# Patient Record
Sex: Male | Born: 1970
Health system: Southern US, Community
[De-identification: ages and names within clinical notes are randomized; demographics above are authoritative.]

## PROBLEM LIST (undated history)

## (undated) DIAGNOSIS — N390 Urinary tract infection, site not specified: Secondary | ICD-10-CM

## (undated) HISTORY — PX: EYE SURGERY: SHX253

## (undated) HISTORY — PX: LASIK: SHX215

---

## 1999-02-21 ENCOUNTER — Emergency Department (HOSPITAL_COMMUNITY): Admission: EM | Admit: 1999-02-21 | Discharge: 1999-02-21 | Payer: Self-pay | Admitting: Emergency Medicine

## 1999-02-28 ENCOUNTER — Emergency Department (HOSPITAL_COMMUNITY): Admission: EM | Admit: 1999-02-28 | Discharge: 1999-02-28 | Payer: Self-pay | Admitting: Emergency Medicine

## 2001-09-23 ENCOUNTER — Encounter: Payer: Self-pay | Admitting: *Deleted

## 2001-09-23 ENCOUNTER — Emergency Department (HOSPITAL_COMMUNITY): Admission: EM | Admit: 2001-09-23 | Discharge: 2001-09-23 | Payer: Self-pay

## 2003-12-03 ENCOUNTER — Encounter: Admission: RE | Admit: 2003-12-03 | Discharge: 2004-01-06 | Payer: Self-pay | Admitting: Family Medicine

## 2004-04-11 ENCOUNTER — Ambulatory Visit: Payer: Self-pay | Admitting: Family Medicine

## 2004-07-01 ENCOUNTER — Ambulatory Visit: Payer: Self-pay | Admitting: Family Medicine

## 2004-10-10 ENCOUNTER — Ambulatory Visit: Payer: Self-pay | Admitting: Family Medicine

## 2004-10-12 ENCOUNTER — Ambulatory Visit: Payer: Self-pay | Admitting: Family Medicine

## 2004-10-13 ENCOUNTER — Emergency Department (HOSPITAL_COMMUNITY): Admission: EM | Admit: 2004-10-13 | Discharge: 2004-10-13 | Payer: Self-pay | Admitting: Emergency Medicine

## 2004-10-28 ENCOUNTER — Ambulatory Visit: Payer: Self-pay | Admitting: Internal Medicine

## 2004-10-31 ENCOUNTER — Ambulatory Visit: Payer: Self-pay | Admitting: *Deleted

## 2004-11-28 ENCOUNTER — Ambulatory Visit: Payer: Self-pay | Admitting: Internal Medicine

## 2004-12-19 ENCOUNTER — Ambulatory Visit: Payer: Self-pay | Admitting: Internal Medicine

## 2005-02-24 ENCOUNTER — Ambulatory Visit: Payer: Self-pay | Admitting: Internal Medicine

## 2005-04-26 ENCOUNTER — Ambulatory Visit: Payer: Self-pay | Admitting: Internal Medicine

## 2005-05-01 ENCOUNTER — Ambulatory Visit: Payer: Self-pay | Admitting: Internal Medicine

## 2005-06-19 ENCOUNTER — Ambulatory Visit: Payer: Self-pay | Admitting: Internal Medicine

## 2005-07-27 ENCOUNTER — Ambulatory Visit: Payer: Self-pay | Admitting: Internal Medicine

## 2006-03-30 ENCOUNTER — Ambulatory Visit: Payer: Self-pay | Admitting: Internal Medicine

## 2006-04-03 ENCOUNTER — Ambulatory Visit: Payer: Self-pay | Admitting: *Deleted

## 2006-04-06 ENCOUNTER — Ambulatory Visit: Payer: Self-pay | Admitting: Internal Medicine

## 2006-08-06 ENCOUNTER — Ambulatory Visit: Payer: Self-pay | Admitting: Internal Medicine

## 2006-08-07 ENCOUNTER — Emergency Department (HOSPITAL_COMMUNITY): Admission: EM | Admit: 2006-08-07 | Discharge: 2006-08-08 | Payer: Self-pay | Admitting: Emergency Medicine

## 2006-10-01 ENCOUNTER — Ambulatory Visit: Payer: Self-pay | Admitting: Internal Medicine

## 2006-10-15 ENCOUNTER — Ambulatory Visit: Payer: Self-pay | Admitting: Internal Medicine

## 2006-10-22 ENCOUNTER — Emergency Department (HOSPITAL_COMMUNITY): Admission: EM | Admit: 2006-10-22 | Discharge: 2006-10-22 | Payer: Self-pay | Admitting: Emergency Medicine

## 2006-11-12 ENCOUNTER — Ambulatory Visit: Payer: Self-pay | Admitting: Internal Medicine

## 2006-12-03 ENCOUNTER — Emergency Department (HOSPITAL_COMMUNITY): Admission: EM | Admit: 2006-12-03 | Discharge: 2006-12-03 | Payer: Self-pay | Admitting: Emergency Medicine

## 2007-02-23 ENCOUNTER — Emergency Department (HOSPITAL_COMMUNITY): Admission: EM | Admit: 2007-02-23 | Discharge: 2007-02-23 | Payer: Self-pay | Admitting: Emergency Medicine

## 2007-03-03 ENCOUNTER — Emergency Department (HOSPITAL_COMMUNITY): Admission: EM | Admit: 2007-03-03 | Discharge: 2007-03-03 | Payer: Self-pay | Admitting: *Deleted

## 2007-03-09 DIAGNOSIS — R300 Dysuria: Secondary | ICD-10-CM | POA: Insufficient documentation

## 2007-03-09 DIAGNOSIS — E785 Hyperlipidemia, unspecified: Secondary | ICD-10-CM

## 2007-03-09 DIAGNOSIS — H409 Unspecified glaucoma: Secondary | ICD-10-CM | POA: Insufficient documentation

## 2007-03-19 ENCOUNTER — Emergency Department (HOSPITAL_COMMUNITY): Admission: EM | Admit: 2007-03-19 | Discharge: 2007-03-19 | Payer: Self-pay | Admitting: Emergency Medicine

## 2007-05-22 ENCOUNTER — Emergency Department (HOSPITAL_COMMUNITY): Admission: EM | Admit: 2007-05-22 | Discharge: 2007-05-22 | Payer: Self-pay | Admitting: Family Medicine

## 2008-04-24 ENCOUNTER — Telehealth (INDEPENDENT_AMBULATORY_CARE_PROVIDER_SITE_OTHER): Payer: Self-pay | Admitting: Nurse Practitioner

## 2008-04-29 ENCOUNTER — Emergency Department (HOSPITAL_COMMUNITY): Admission: EM | Admit: 2008-04-29 | Discharge: 2008-04-29 | Payer: Self-pay | Admitting: Emergency Medicine

## 2008-05-26 ENCOUNTER — Emergency Department (HOSPITAL_COMMUNITY): Admission: EM | Admit: 2008-05-26 | Discharge: 2008-05-26 | Payer: Self-pay | Admitting: Family Medicine

## 2011-05-01 LAB — CBC
HCT: 44
Hemoglobin: 14.3
MCHC: 32.5
MCV: 95.7
Platelets: 367
RBC: 4.59
RDW: 13.8
WBC: 7.5

## 2011-05-01 LAB — DIFFERENTIAL
Eosinophils Relative: 2
Lymphocytes Relative: 26
Lymphs Abs: 1.9
Monocytes Absolute: 0.6

## 2011-05-01 LAB — ANA: Anti Nuclear Antibody(ANA): NEGATIVE

## 2011-10-25 ENCOUNTER — Encounter (HOSPITAL_COMMUNITY): Payer: Self-pay | Admitting: Emergency Medicine

## 2011-10-25 ENCOUNTER — Other Ambulatory Visit (HOSPITAL_COMMUNITY): Payer: Self-pay | Admitting: Pharmacy Technician

## 2011-10-25 ENCOUNTER — Emergency Department (HOSPITAL_COMMUNITY)
Admission: EM | Admit: 2011-10-25 | Discharge: 2011-10-25 | Disposition: A | Discharge status: Home / Self Care | Payer: Self-pay | Attending: Emergency Medicine | Admitting: Emergency Medicine

## 2011-10-25 DIAGNOSIS — R300 Dysuria: Secondary | ICD-10-CM | POA: Insufficient documentation

## 2011-10-25 LAB — URINE MICROSCOPIC-ADD ON

## 2011-10-25 LAB — URINALYSIS, ROUTINE W REFLEX MICROSCOPIC
Glucose, UA: NEGATIVE mg/dL
Hgb urine dipstick: NEGATIVE
Specific Gravity, Urine: 1.026 (ref 1.005–1.030)
pH: 7.5 (ref 5.0–8.0)

## 2011-10-25 MED ORDER — CIPROFLOXACIN HCL 500 MG PO TABS: 500.0000 mg | ORAL_TABLET | Freq: Two times a day (BID) | ORAL | Status: AC

## 2011-10-25 MED ORDER — CIPROFLOXACIN HCL 500 MG PO TABS
500.0000 mg | ORAL_TABLET | Freq: Once | ORAL | Status: AC
  Administered 2011-10-25: 500 mg via ORAL
  Filled 2011-10-25: qty 1

## 2011-10-25 NOTE — ED Notes (Signed)
Pt states pain is only when he urinates

## 2011-10-25 NOTE — ED Notes (Signed)
Pt presenting to ed with c/o penile discomfort pt states onset x 2 weeks. Pt states "I'm having left over urine I think it might be a clear discharge but it's not messing up my under clothes" pt is alert and oriented at this time. Pt denies burning with urination at this time

## 2011-10-25 NOTE — ED Provider Notes (Signed)
History     CSN: [id number]  Arrival date & time 10/25/11  1634   First MD Initiated Contact with Patient 10/25/11 1737      Chief Complaint  Patient presents with  . Dysuria    (Consider location/radiation/quality/duration/timing/severity/associated sxs/prior treatment) HPI Comments: Patient states he has some dysuria at the end of history and this is different than previous STDs, that he's had is one sexual partner is currently being treated for a "yeast infection."  Patient is a 42 y.o. male presenting with dysuria. The history is provided by the patient.  Dysuria  This is a new problem. The current episode started more than 1 week ago. The problem occurs every urination. The problem has not changed since onset.The quality of the pain is described as burning. The pain is at a severity of 2/10. The pain is mild. There has been no fever. He is sexually active. Pertinent negatives include no nausea, no discharge, no frequency and no urgency.    Past Medical History  Diagnosis Date  . Glaucoma     Past Surgical History  Procedure Date  . Eye surgery     No family history on file.  History  Substance Use Topics  . Smoking status: Former Games developer  . Smokeless tobacco: Not on file  . Alcohol Use: No      Review of Systems  Constitutional: Negative for fever.  Gastrointestinal: Negative for nausea.  Genitourinary: Positive for dysuria. Negative for urgency, frequency, decreased urine volume, penile swelling, scrotal swelling, penile pain and testicular pain.  Musculoskeletal: Negative for back pain.    Allergies  Review of patient's allergies indicates no known allergies.  Home Medications   Current Outpatient Rx  Name Route Sig Dispense Refill  . LATANOPROST 0.005 % OP SOLN Both Eyes Place 1 drop into both eyes at bedtime.    Marland Kitchen CIPROFLOXACIN HCL 500 MG PO TABS Oral Take 1 tablet (500 mg total) by mouth 2 (two) times daily. 14 tablet 0    BP 138/88  Pulse 97   Temp(Src) 98.5 F (36.9 C) (Oral)  Resp 18  SpO2 97%  Physical Exam  Constitutional: He is oriented to person, place, and time. He appears well-developed and well-nourished.  HENT:  Head: Normocephalic.  Eyes: Pupils are equal, round, and reactive to light.  Neck: Normal range of motion.  Cardiovascular: Normal rate.   Pulmonary/Chest: Effort normal.  Genitourinary: Penis normal. Circumcised. No penile erythema or penile tenderness. No discharge found.  Musculoskeletal: Normal range of motion.  Neurological: He is alert and oriented to person, place, and time.  Skin: Skin is warm.    ED Course  Procedures (including critical care time)  Labs Reviewed  URINALYSIS, ROUTINE W REFLEX MICROSCOPIC - Abnormal; Notable for the following:    APPearance TURBID (*)    All other components within normal limits  URINE MICROSCOPIC-ADD ON  GC/CHLAMYDIA PROBE AMP, GENITAL   No results found.   1. Dysuria       MDM  Dysuria        Arman Filter, NP 10/25/11 2355

## 2011-10-25 NOTE — Discharge Instructions (Signed)
Dysuria You have dysuria. This is pain on urination. Dysuria is often present with other symptoms such as:  A sudden urge to go.   Having to go more often.  Dysuria can be caused by:  Urinary tract infections.   Yeast infections.   Prostate problems.   Urinary stones.   Sexually transmitted diseases.  Lab tests of the urine will usually be needed to confirm a urinary infection. An infection is the cause of dysuria in over half the cases. In older men the prostate gland enlarges and can cause urinary problems. These include:   Urinary obstruction.   Infection.   Pain on urination.  Bladder cancer can also cause blood in the urine and dysuria. If you have an infection, be sure to take the antibiotics prescribed for you until they are gone. This will help prevent a recurrence. Further checking by a specialist may be needed if the cause of your dysuria is not found. Cystoscopy, x-rays, pelvic exams, and special cultures may be needed to find the cause and help find the best treatment. See your caregiver right away if your symptoms are not improved after three days.  SEEK IMMEDIATE MEDICAL CARE IF:  You have difficulty urinating, pass bloody urine, or have chills or a fever. Document Released: 07/17/2005 Document Revised: 07/06/2011 Document Reviewed: 01/01/2007 [employer] Patient Information 2012 [employer]. 

## 2011-10-26 ENCOUNTER — Emergency Department (HOSPITAL_COMMUNITY)
Admission: EM | Admit: 2011-10-26 | Discharge: 2011-10-26 | Discharge status: Absent without leave | Payer: Self-pay | Source: Home / Self Care

## 2011-10-26 LAB — GC/CHLAMYDIA PROBE AMP, GENITAL
Chlamydia, DNA Probe: NEGATIVE
GC Probe Amp, Genital: NEGATIVE

## 2011-10-26 NOTE — ED Provider Notes (Signed)
Medical screening examination/treatment/procedure(s) were performed by non-physician practitioner and as supervising physician I was immediately available for consultation/collaboration.    Heru Montz L Jakerra Floyd, MD 10/26/11 2341 

## 2011-10-30 ENCOUNTER — Emergency Department (INDEPENDENT_AMBULATORY_CARE_PROVIDER_SITE_OTHER)
Admission: EM | Admit: 2011-10-30 | Discharge: 2011-10-30 | Disposition: A | Discharge status: Home / Self Care | Payer: Self-pay | Source: Home / Self Care | Attending: Emergency Medicine | Admitting: Emergency Medicine

## 2011-10-30 ENCOUNTER — Encounter (HOSPITAL_COMMUNITY): Payer: Self-pay | Admitting: *Deleted

## 2011-10-30 DIAGNOSIS — N509 Disorder of male genital organs, unspecified: Secondary | ICD-10-CM

## 2011-10-30 DIAGNOSIS — R300 Dysuria: Secondary | ICD-10-CM

## 2011-10-30 DIAGNOSIS — IMO0002 Reserved for concepts with insufficient information to code with codable children: Secondary | ICD-10-CM

## 2011-10-30 LAB — POCT URINALYSIS DIP (DEVICE)
Glucose, UA: NEGATIVE mg/dL
Hgb urine dipstick: NEGATIVE
Protein, ur: NEGATIVE mg/dL
Specific Gravity, Urine: >=1.03 (ref 1.005–1.030)
Urobilinogen, UA: 0.2 mg/dL (ref 0.0–1.0)

## 2011-10-30 MED ORDER — DOXYCYCLINE HYCLATE 100 MG PO CAPS: 100.0000 mg | ORAL_CAPSULE | Freq: Two times a day (BID) | ORAL | Status: AC

## 2011-10-30 NOTE — Discharge Instructions (Signed)
Review your previous urine and urethral studieshave recommended that you see a urologist this time. phone number below. We have again screened you for STD'S or an infection source in your urine.have tried to explain to her today that perhaps based on your previous urine exam that you are excreting some protein derivatives in your urine as your urate it is most likely to be the source of your discomfort is waiting to see the urologist for further evaluation and workup     Dysuria Dysuria is the medical term for pain with urination. There are many causes for dysuria, but urinary tract infection is the most common. If a urinalysis was performed it can show that there is a urinary tract infection. A urine culture confirms that you or your child is sick. You will need to follow up with a healthcare provider because:  If a urine culture was done you will need to know the culture results and treatment recommendations.   If the urine culture was positive, you or your child will need to be put on antibiotics or know if the antibiotics prescribed are the right antibiotics for your urinary tract infection.   If the urine culture is negative (no urinary tract infection), then other causes may need to be explored or antibiotics need to be stopped.  Today laboratory work may have been done and there does not seem to be an infection. If cultures were done they will take at least 24 to 48 hours to be completed. Today x-rays may have been taken and they read as normal. No cause can be found for the problems. The x-rays may be re-read by a radiologist and you will be contacted if additional findings are made. You or your child may have been put on medications to help with this problem until you can see your primary caregiver. If the problems get better, see your primary caregiver if the problems return. If you were given antibiotics (medications which kill germs), take all of the mediations as directed for the full  course of treatment.  If laboratory work was done, you need to find the results. Leave a telephone number where you can be reached. If this is not possible, make sure you find out how you are to get test results. HOME CARE INSTRUCTIONS   Drink lots of fluids. For adults, drink eight, 8 ounce glasses of clear juice or water a day. For children, replace fluids as suggested by your caregiver.   Empty the bladder often. Avoid holding urine for long periods of time.   After a bowel movement, women should cleanse front to back, using each tissue only once.   Empty your bladder before and after sexual intercourse.   Take all the medicine given to you until it is gone. You may feel better in a few days, but TAKE ALL MEDICINE.   Avoid caffeine, tea, alcohol and carbonated beverages, because they tend to irritate the bladder.   In men, alcohol may irritate the prostate.   Only take over-the-counter or prescription medicines for pain, discomfort, or fever as directed by your caregiver.   If your caregiver has given you a follow-up appointment, it is very important to keep that appointment. Not keeping the appointment could result in a chronic or permanent injury, pain, and disability. If there is any problem keeping the appointment, you must call back to this facility for assistance.  SEEK IMMEDIATE MEDICAL CARE IF:   Back pain develops.   A fever develops.  There is nausea (feeling sick to your stomach) or vomiting (throwing up).   Problems are no better with medications or are getting worse.  MAKE SURE YOU:   Understand these instructions.   Will watch your condition.   Will get help right away if you are not doing well or get worse.  Document Released: 04/14/2004 Document Revised: 07/06/2011 Document Reviewed: 02/20/2008 [employer] Patient Information 2012 [place], [place].

## 2011-10-30 NOTE — ED Notes (Addendum)
C/O pain at end of urination x 2-3 weeks with some clear penile discharge.  States sxs are improving, but partner was just treated "for some kind of infection with PCN".  Pt treated 3/27 for UTI - completed course of Cipro with minimal improvement.

## 2011-10-30 NOTE — ED Provider Notes (Signed)
History     CSN: [id number]  Arrival date & time 10/30/11  1626   First MD Initiated Contact with Patient 10/30/11 1722      Chief Complaint  Patient presents with  . Dysuria  . Penile Discharge    (Consider location/radiation/quality/duration/timing/severity/associated sxs/prior treatment) HPI Comments: Patient also describes that she has seen some clear discharge from his penis and continues to have discomfort in urination especially at the end. Patient describes that his partner was treated with penicillin for some type of infection. He went to the emergency department had some studies done but after completing the antibiotic was prescribed have not improved. No fevers no abdominal pains.  Patient is a 41 y.o. male presenting with dysuria and penile discharge. The history is provided by the patient.  Dysuria  This is a recurrent problem. The current episode started more than 1 week ago. The problem occurs every urination. The problem has not changed since onset.The quality of the pain is described as burning. The pain is at a severity of 5/10. The pain is moderate. There has been no fever. Associated symptoms include discharge. Pertinent negatives include no chills, no sweats, no vomiting, no urgency and no flank pain.  Penile Discharge    Past Medical History  Diagnosis Date  . Glaucoma     Past Surgical History  Procedure Date  . Eye surgery     No family history on file.  History  Substance Use Topics  . Smoking status: Former Games developer  . Smokeless tobacco: Not on file  . Alcohol Use: No      Review of Systems  Constitutional: Negative for fever, chills and activity change.  Gastrointestinal: Negative for vomiting.  Genitourinary: Positive for dysuria, discharge, penile pain and testicular pain. Negative for urgency, flank pain and penile swelling.    Allergies  Review of patient's allergies indicates no known allergies.  Home Medications   Current  Outpatient Rx  Name Route Sig Dispense Refill  . LATANOPROST 0.005 % OP SOLN Both Eyes Place 1 drop into both eyes at bedtime.    Marland Kitchen CIPROFLOXACIN HCL 500 MG PO TABS Oral Take 1 tablet (500 mg total) by mouth 2 (two) times daily. 14 tablet 0  . DOXYCYCLINE HYCLATE 100 MG PO CAPS Oral Take 1 capsule (100 mg total) by mouth 2 (two) times daily. 20 capsule 0    BP 120/82  Pulse 73  Temp(Src) 97.1 F (36.2 C) (Oral)  Resp 18  SpO2 97%  Physical Exam  Constitutional: He appears well-developed and well-nourished. No distress.  HENT:  Head: Normocephalic.  Eyes: Conjunctivae are normal.  Neck: Neck supple.  Pulmonary/Chest: No respiratory distress.  Abdominal: He exhibits no distension. Hernia confirmed negative in the right inguinal area and confirmed negative in the left inguinal area.  Genitourinary: Prostate normal, testes normal and penis normal.    Right testis shows no swelling and no tenderness. Left testis shows no swelling and no tenderness. No phimosis, paraphimosis, hypospadias, penile erythema or penile tenderness. No discharge found.  Musculoskeletal: He exhibits no tenderness.  Lymphadenopathy:       Right: No inguinal adenopathy present.       Left: No inguinal adenopathy present.  Neurological: He is alert.  Skin: No rash noted. No erythema.    ED Course  Procedures (including critical care time)   Labs Reviewed  POCT URINALYSIS DIP (DEVICE)  URINALYSIS, DIPSTICK ONLY  GC/CHLAMYDIA PROBE AMP, GENITAL   No results found.   1. Dysuria  2. Testicular/scrotal pain       MDM  Patient continues describing dysphoria ongoing for about 3 weeks have review previous urine exam in urethral culture results. Your was not consistent with infection nor Chlamydia and gonorrhea screening test.. Noted that previous urine was remarkable for oxalate urate. Have encouraged patient to followup with a urologist as he continues to express a sore in and have  Repeated tests today  including another urine in urethral swab test. As patient also experienced some nonspecific discomfort on a testicle although was not reproduced during exam today decided to have the patient take a cycle of doxycycline before potentially not Chlamydia or car rear urethritis.        Jimmie Molly, MD 10/30/11 2035

## 2011-11-05 ENCOUNTER — Emergency Department (HOSPITAL_COMMUNITY)
Admission: EM | Admit: 2011-11-05 | Discharge: 2011-11-05 | Disposition: A | Discharge status: Home / Self Care | Payer: Self-pay | Attending: Emergency Medicine | Admitting: Emergency Medicine

## 2011-11-05 ENCOUNTER — Encounter (HOSPITAL_COMMUNITY): Payer: Self-pay

## 2011-11-05 DIAGNOSIS — R109 Unspecified abdominal pain: Secondary | ICD-10-CM | POA: Insufficient documentation

## 2011-11-05 DIAGNOSIS — R369 Urethral discharge, unspecified: Secondary | ICD-10-CM

## 2011-11-05 DIAGNOSIS — N419 Inflammatory disease of prostate, unspecified: Secondary | ICD-10-CM

## 2011-11-05 DIAGNOSIS — R300 Dysuria: Secondary | ICD-10-CM

## 2011-11-05 HISTORY — DX: Urinary tract infection, site not specified: N39.0

## 2011-11-05 LAB — URINALYSIS, ROUTINE W REFLEX MICROSCOPIC
Glucose, UA: NEGATIVE mg/dL
Hgb urine dipstick: NEGATIVE
Protein, ur: NEGATIVE mg/dL

## 2011-11-05 MED ORDER — CEFTRIAXONE SODIUM 250 MG IJ SOLR
250.0000 mg | Freq: Once | INTRAMUSCULAR | Status: AC
  Administered 2011-11-05: 250 mg via INTRAMUSCULAR
  Filled 2011-11-05: qty 250

## 2011-11-05 MED ORDER — CIPROFLOXACIN HCL 500 MG PO TABS: 500.0000 mg | ORAL_TABLET | Freq: Two times a day (BID) | ORAL | Status: AC

## 2011-11-05 MED ORDER — AZITHROMYCIN 1 G PO PACK
1.0000 g | PACK | Freq: Once | ORAL | Status: AC
  Administered 2011-11-05: 1 g via ORAL
  Filled 2011-11-05 (×2): qty 1

## 2011-11-05 NOTE — Discharge Instructions (Signed)
Follow up with the doctor provided. Return here as needed. °

## 2011-11-05 NOTE — ED Notes (Signed)
Pt presents with c/o of discomfort to prostate- Hx of the same- was given a PCN shot in the past and "it cleared up". Discharge is clear

## 2011-11-05 NOTE — ED Provider Notes (Signed)
Medical screening examination/treatment/procedure(s) were performed by non-physician practitioner and as supervising physician I was immediately available for consultation/collaboration.  Doug Sou, MD 11/05/11 2151

## 2011-11-05 NOTE — ED Provider Notes (Signed)
History     CSN: [id number]  Arrival date & time 11/05/11  1416   First MD Initiated Contact with Patient 11/05/11 1624      Chief Complaint  Patient presents with  . Groin Pain  . Penile Discharge    (Consider location/radiation/quality/duration/timing/severity/associated sxs/prior treatment) HPI Patient presented with continued dysuria and pain with urination.  States that he has had some clear discharge as well.  Patient has been seen twice given short courses of antibiotics orally.  Patient states the symptoms have not resolved and really did not seem to help in the past for these symptoms are a shot and some oral medications. Past Medical History  Diagnosis Date  . Glaucoma   . UTI (lower urinary tract infection)     Past Surgical History  Procedure Date  . Eye surgery     No family history on file.  History  Substance Use Topics  . Smoking status: Former Games developer  . Smokeless tobacco: Not on file  . Alcohol Use: No      Review of Systems All pertinent positives and negatives reviewed in the history of present illness  Allergies  Review of patient's allergies indicates no known allergies.  Home Medications   Current Outpatient Rx  Name Route Sig Dispense Refill  . LATANOPROST 0.005 % OP SOLN Both Eyes Place 1 drop into both eyes at bedtime.    Marland Kitchen CIPROFLOXACIN HCL 500 MG PO TABS Oral Take 1 tablet (500 mg total) by mouth 2 (two) times daily. 14 tablet 0  . DOXYCYCLINE HYCLATE 100 MG PO CAPS Oral Take 1 capsule (100 mg total) by mouth 2 (two) times daily. 20 capsule 0    BP 119/82  Pulse 83  Temp(Src) 97.9 F (36.6 C) (Oral)  Resp 16  SpO2 100%  Physical Exam  Constitutional: He is oriented to person, place, and time. He appears well-developed and well-nourished. No distress.  HENT:  Head: Normocephalic and atraumatic.  Mouth/Throat: Oropharynx is clear and moist. No oropharyngeal exudate.  Eyes: Pupils are equal, round, and reactive to light.    Neck: Normal range of motion. Neck supple.  Cardiovascular: Normal rate, regular rhythm and normal heart sounds.   Pulmonary/Chest: Effort normal and breath sounds normal.  Genitourinary: Testes normal. Right testis shows no swelling and no tenderness. Left testis shows no swelling and no tenderness. No penile tenderness. Discharge found.  Neurological: He is alert and oriented to person, place, and time.    ED Course  Procedures (including critical care time)  Patient has not been adequately treated for STD or prostatitis.  Review the patient treatment.  Risks to be along with prostatitis based on his HPI.  Patient to be referred to urology as well.   MDM   MDM Reviewed: nursing note, vitals and previous chart Reviewed previous: labs           Carlyle Dolly, PA-C 11/05/11 1658

## 2011-11-09 ENCOUNTER — Encounter (HOSPITAL_COMMUNITY): Payer: Self-pay | Admitting: Emergency Medicine

## 2011-11-09 ENCOUNTER — Emergency Department (HOSPITAL_COMMUNITY)
Admission: EM | Admit: 2011-11-09 | Discharge: 2011-11-10 | Disposition: A | Discharge status: Home / Self Care | Payer: Self-pay | Attending: Emergency Medicine | Admitting: Emergency Medicine

## 2011-11-09 DIAGNOSIS — Z87891 Personal history of nicotine dependence: Secondary | ICD-10-CM | POA: Insufficient documentation

## 2011-11-09 DIAGNOSIS — R300 Dysuria: Secondary | ICD-10-CM | POA: Insufficient documentation

## 2011-11-09 DIAGNOSIS — H409 Unspecified glaucoma: Secondary | ICD-10-CM | POA: Insufficient documentation

## 2011-11-09 NOTE — ED Notes (Signed)
Pt alert, nad, c/o pain in prostate area, UTI s/s, states seen several times with same c/o, resp even unlabored, skin pwd

## 2011-11-10 LAB — URINALYSIS, ROUTINE W REFLEX MICROSCOPIC
Glucose, UA: NEGATIVE mg/dL
Ketones, ur: NEGATIVE mg/dL
Leukocytes, UA: NEGATIVE
pH: 6 (ref 5.0–8.0)

## 2011-11-10 MED ORDER — PENICILLIN V POTASSIUM 500 MG PO TABS: 500.0000 mg | ORAL_TABLET | Freq: Four times a day (QID) | ORAL | Status: AC

## 2011-11-10 MED ORDER — AMOXICILLIN 500 MG PO CAPS: 500.0000 mg | ORAL_CAPSULE | Freq: Three times a day (TID) | ORAL | Status: DC

## 2011-11-10 NOTE — Discharge Instructions (Signed)

## 2011-11-10 NOTE — ED Provider Notes (Signed)
History     CSN: [id number]  Arrival date & time 11/09/11  2155   First MD Initiated Contact with Patient 11/10/11 0003      Chief Complaint  Patient presents with  . Urinary Tract Infection    (Consider location/radiation/quality/duration/timing/severity/associated sxs/prior treatment) HPI  Pt presents to the ED with complaints of dysuria. He has been seen four times in the past for this episode over the past month and this problem is reoccuring. He states that in the past when he has had this problem he was given amoxicillin and it "knocked it out". He states that the doctors he has been seeing have given him Cipro and doxy but that he knew that these would not work. He states "I need amoxicillin, i tried the other two and they have no worked. Amoxicillin worked". Pt states the Urologist told him he needs $250 before he can be seen for an appointment. He denies any new symptoms or change/worsening of symptoms. He states that he has clear discharge and that it burns when he urinates. The testicular pain has much resolved.  Past Medical History  Diagnosis Date  . Glaucoma   . UTI (lower urinary tract infection)     Past Surgical History  Procedure Date  . Eye surgery     No family history on file.  History  Substance Use Topics  . Smoking status: Former Games developer  . Smokeless tobacco: Not on file  . Alcohol Use: No      Review of Systems  All other systems reviewed and are negative.    Allergies  Review of patient's allergies indicates no known allergies.  Home Medications   Current Outpatient Rx  Name Route Sig Dispense Refill  . AMOXICILLIN 500 MG PO CAPS Oral Take 1 capsule (500 mg total) by mouth 3 (three) times daily. 21 capsule 0  . CIPROFLOXACIN HCL 500 MG PO TABS Oral Take 1 tablet (500 mg total) by mouth 2 (two) times daily. 40 tablet 0  . DOXYCYCLINE HYCLATE 100 MG PO CAPS Oral Take 1 capsule (100 mg total) by mouth 2 (two) times daily. 20 capsule 0  .  LATANOPROST 0.005 % OP SOLN Both Eyes Place 1 drop into both eyes at bedtime.      BP 130/85  Pulse 87  Temp 98 F (36.7 C)  Resp 16  Wt 200 lb (90.719 kg)  SpO2 97%  Physical Exam  Nursing note and vitals reviewed. Constitutional: He appears well-developed and well-nourished. No distress.  HENT:  Head: Normocephalic and atraumatic.  Eyes: Pupils are equal, round, and reactive to light.  Neck: Normal range of motion. Neck supple.  Cardiovascular: Normal rate and regular rhythm.   Pulmonary/Chest: Effort normal.  Abdominal: Soft. There is no tenderness. There is no rebound.  Genitourinary: Penis normal.  Neurological: He is alert.  Skin: Skin is warm and dry.    ED Course  Procedures (including critical care time)  Labs Reviewed  URINALYSIS, ROUTINE W REFLEX MICROSCOPIC - Abnormal; Notable for the following:    APPearance CLOUDY (*)    All other components within normal limits   No results found.   1. Dysuria       MDM  Pt requesting Amoxicillin. Pts urine has come back negative for infection. Pt has agreed to try to save up money to see urology. He denies having urinary retention. He has tried multiple antibiotics and really wants to try Amoxicillin. I have given pt Rx for Amox and told  him to return the ED if this does not resolve patients pain but did inform him that there is not much more that can be done in the ED at this point and that he really needs to see the urologist.  Pt has been advised of the symptoms that warrant their return to the ED. Patient has voiced understanding and has agreed to follow-up with the PCP or specialist.         Dorthula Matas, PA 11/10/11 [phone]

## 2011-11-11 NOTE — ED Provider Notes (Signed)
Medical screening examination/treatment/procedure(s) were performed by non-physician practitioner and as supervising physician I was immediately available for consultation/collaboration.  Burnett Spray, MD 11/11/11 0829 

## 2011-11-21 ENCOUNTER — Emergency Department (HOSPITAL_COMMUNITY)
Admission: EM | Admit: 2011-11-21 | Discharge: 2011-11-22 | Disposition: A | Discharge status: Home / Self Care | Payer: Self-pay | Attending: Emergency Medicine | Admitting: Emergency Medicine

## 2011-11-21 ENCOUNTER — Encounter (HOSPITAL_COMMUNITY): Payer: Self-pay | Admitting: Emergency Medicine

## 2011-11-21 DIAGNOSIS — N481 Balanitis: Secondary | ICD-10-CM

## 2011-11-21 DIAGNOSIS — L989 Disorder of the skin and subcutaneous tissue, unspecified: Secondary | ICD-10-CM | POA: Insufficient documentation

## 2011-11-21 DIAGNOSIS — N476 Balanoposthitis: Secondary | ICD-10-CM | POA: Insufficient documentation

## 2011-11-21 DIAGNOSIS — H409 Unspecified glaucoma: Secondary | ICD-10-CM | POA: Insufficient documentation

## 2011-11-21 DIAGNOSIS — R599 Enlarged lymph nodes, unspecified: Secondary | ICD-10-CM | POA: Insufficient documentation

## 2011-11-21 DIAGNOSIS — N509 Disorder of male genital organs, unspecified: Secondary | ICD-10-CM | POA: Insufficient documentation

## 2011-11-21 DIAGNOSIS — N4289 Other specified disorders of prostate: Secondary | ICD-10-CM | POA: Insufficient documentation

## 2011-11-21 DIAGNOSIS — N4281 Prostatodynia syndrome: Secondary | ICD-10-CM

## 2011-11-21 DIAGNOSIS — R369 Urethral discharge, unspecified: Secondary | ICD-10-CM | POA: Insufficient documentation

## 2011-11-21 DIAGNOSIS — R300 Dysuria: Secondary | ICD-10-CM | POA: Insufficient documentation

## 2011-11-21 NOTE — ED Notes (Signed)
Pt states he is having discharge from his penis for about the past 3 weeks  Pt states he also has a burning sensation after he is done urinating

## 2011-11-22 LAB — URINALYSIS, ROUTINE W REFLEX MICROSCOPIC
Leukocytes, UA: NEGATIVE
Nitrite: NEGATIVE
Specific Gravity, Urine: 1.028 (ref 1.005–1.030)
pH: 6 (ref 5.0–8.0)

## 2011-11-22 MED ORDER — CIPROFLOXACIN HCL 500 MG PO TABS: 500.0000 mg | ORAL_TABLET | Freq: Two times a day (BID) | ORAL | Status: AC

## 2011-11-22 MED ORDER — CLOTRIMAZOLE 1 % EX CREA: TOPICAL_CREAM | CUTANEOUS | Status: DC

## 2011-11-22 NOTE — ED Notes (Signed)
Pt reports he was seen here about a week ago and given PCN but thinks that is the wrong drug for his symptoms. Pt reports he looked on the internet and for the symptoms he has he thinks he needs "doxycillin". Asked pt if he meant doxycycline and he says yes. Pt educated on completion of medication prescribed for infections. Pt states he understands he needs to complete the medication if feeling better.

## 2011-11-22 NOTE — ED Provider Notes (Signed)
History     CSN: [id number]  Arrival date & time 11/21/11  2244   First MD Initiated Contact with Patient 11/22/11 0041      Chief Complaint  Patient presents with  . urinary problems     (Consider location/radiation/quality/duration/timing/severity/associated sxs/prior treatment) HPI Comments: This is the fifth visit in a month for this patient with complaint of dysuria and a thin, clear, penile discharge.  He has been treated with several different antibiotics for UTI symptoms without resolution.  He states he is saving up to see the urologist. During this time.  He is tested twice negative for GC, chlamydia, urinary tract infections.  The history is provided by the patient.    Past Medical History  Diagnosis Date  . Glaucoma   . UTI (lower urinary tract infection)     Past Surgical History  Procedure Date  . Eye surgery     History reviewed. No pertinent family history.  History  Substance Use Topics  . Smoking status: Former Games developer  . Smokeless tobacco: Not on file  . Alcohol Use: No      Review of Systems  Constitutional: Negative for fever.  Gastrointestinal: Negative for abdominal distention and rectal pain.  Genitourinary: Positive for dysuria. Negative for frequency, scrotal swelling and genital sores.    Allergies  Review of patient's allergies indicates no known allergies.  Home Medications   Current Outpatient Rx  Name Route Sig Dispense Refill  . LATANOPROST 0.005 % OP SOLN Both Eyes Place 1 drop into both eyes at bedtime.    Marland Kitchen CIPROFLOXACIN HCL 500 MG PO TABS Oral Take 1 tablet (500 mg total) by mouth every 12 (twelve) hours. 42 tablet 0  . CLOTRIMAZOLE 1 % EX CREA  Apply to affected area 2 times daily 15 g 0    BP 98/66  Pulse 58  Temp(Src) 98 F (36.7 C) (Oral)  Resp 18  SpO2 99%  Physical Exam  Constitutional: He is oriented to person, place, and time. He appears well-developed and well-nourished.  HENT:  Head: Normocephalic.    Eyes: Pupils are equal, round, and reactive to light.  Neck: Normal range of motion.  Cardiovascular: Normal rate.   Pulmonary/Chest: Effort normal.  Abdominal: Soft. He exhibits no distension. There is no tenderness.  Genitourinary: Testes normal. Rectal exam shows mass. Rectal exam shows no external hemorrhoid, no fissure, no tenderness and anal tone normal. Prostate is tender. Prostate is not enlarged. Cremasteric reflex is present. Circumcised. Penile tenderness present. No penile erythema. No discharge found.       Genital lesions.  Anal lymphadenopathy.  Patient is a circumcised male, appears in meatal opening  Musculoskeletal: Normal range of motion.  Neurological: He is alert and oriented to person, place, and time.  Skin: Skin is warm.    ED Course  Procedures (including critical care time)   Labs Reviewed  URINALYSIS, ROUTINE W REFLEX MICROSCOPIC   No results found.   1. Balanitis   2. Dysuria   3. Prostate pain       MDM          Arman Filter, NP 11/22/11 [phone]

## 2011-11-22 NOTE — Discharge Instructions (Signed)
Balanitis Balanitis is an common infection of the head (glans) of the penis. CAUSES  Balanitis has multiple causes. Frequently balanitis is the result of poor personal hygiene. Especially if no circumcision has been done. Without adequate washing, many different kinds of germs (viruses, bacteria, and yeast) collect between the foreskin and the glans. This can cause an infection. Lack of air and irritation from a normal secretion called smegma contribute to the cause in uncircumcised males. Other causes include chemical irritation by certain soaps (especially soaps with perfumes). When no circumcision has been done, a frequent cause of poor hygiene is that the tip of the foreskin is tight (phimosis) and cannot be pulled back for adequate washing. Illnesses in other areas of the body can also cause balanitis. This includes illnesses that cause water retention and swelling, such as:  Heart failure.   Cirrhosis of the liver.   Kidney problems.  Other contributing causes include:  Obesity.   Certain allergies to drugs such as tetracycline and sulfa.   Diabetes.  SYMPTOMS  Symptoms may include:  Discharge coming from under the foreskin.   Tenderness.   Itching and inability to get an erection (because of the pain).   Redness and a rash is frequently seen.   If the problem remains for a while, sores can be seen on the glans and on the foreskin.  If the condition is not treated other complications such as a scar of the opening to the urethra (tube that carries the urine out from the bladder) can occur and block the bladder. This narrowing is called meatal stenosis. Other problems can occur such as:  Infection of the lymph nodes in the crease of the groin.   Ballooning of the foreskin when voiding (when the foreskin opening has scarred down and been made smaller).   Blockage of the bladder.   Frequent urinary infections occur in children with balanitis.  HOME CARE INSTRUCTIONS   Pull  back foreskin to urinate and when washing.   Pull back foreskin when putting medication on the affected area to prevent the foreskin from swelling and being trapped behind the head.   Keep foreskin and glans clean and dry.   Sitz baths may be helpful.   Take your medication as directed .   Pain medication, if needed.   Circumcision (may be recommended).  SEEK IMMEDIATE MEDICAL CARE IF:   The affected area becomes trapped behind the head.   You start a fever.   The swelling increases.  Document Released: 12/03/2008 Document Revised: 07/06/2011 Document Reviewed: 12/03/2008 [facility] Patient Information 2012 [place], [place].Dysuria You have dysuria. This is pain on urination. Dysuria is often present with other symptoms such as:  A sudden urge to go.   Having to go more often.  Dysuria can be caused by:  Urinary tract infections.   Yeast infections.   Prostate problems.   Urinary stones.   Sexually transmitted diseases.  Lab tests of the urine will usually be needed to confirm a urinary infection. An infection is the cause of dysuria in over half the cases. In older men the prostate gland enlarges and can cause urinary problems. These include:   Urinary obstruction.   Infection.   Pain on urination.  Bladder cancer can also cause blood in the urine and dysuria. If you have an infection, be sure to take the antibiotics prescribed for you until they are gone. This will help prevent a recurrence. Further checking by a specialist may be needed if the  cause of your dysuria is not found. Cystoscopy, x-rays, pelvic exams, and special cultures may be needed to find the cause and help find the best treatment. See your caregiver right away if your symptoms are not improved after three days.  SEEK IMMEDIATE MEDICAL CARE IF:  You have difficulty urinating, pass bloody urine, or have chills or a fever. Document Released: 07/17/2005 Document Revised: 07/06/2011 Document Reviewed:  01/01/2007 [facility] Patient Information 2012 [place], [place].Dysuria You have dysuria. This is pain on urination. Dysuria is often present with other symptoms such as:  A sudden urge to go.   Having to go more often.  Dysuria can be caused by:  Urinary tract infections.   Yeast infections.   Prostate problems.   Urinary stones.   Sexually transmitted diseases.  Lab tests of the urine will usually be needed to confirm a urinary infection. An infection is the cause of dysuria in over half the cases. In older men the prostate gland enlarges and can cause urinary problems. These include:   Urinary obstruction.   Infection.   Pain on urination.  Bladder cancer can also cause blood in the urine and dysuria. If you have an infection, be sure to take the antibiotics prescribed for you until they are gone. This will help prevent a recurrence. Further checking by a specialist may be needed if the cause of your dysuria is not found. Cystoscopy, x-rays, pelvic exams, and special cultures may be needed to find the cause and help find the best treatment. See your caregiver right away if your symptoms are not improved after three days.  SEEK IMMEDIATE MEDICAL CARE IF:  You have difficulty urinating, pass bloody urine, or have chills or a fever. Document Released: 07/17/2005 Document Revised: 07/06/2011 Document Reviewed: 01/01/2007 [facility] Patient Information 2012 [place], [place].

## 2011-11-24 NOTE — ED Provider Notes (Signed)
Medical screening examination/treatment/procedure(s) were performed by non-physician practitioner and as supervising physician I was immediately available for consultation/collaboration. Boris Engelmann, MD, FACEP   Virgin Zellers L Calahan Pak, MD 11/24/11 0815 

## 2012-06-29 ENCOUNTER — Emergency Department (HOSPITAL_COMMUNITY)
Admission: EM | Admit: 2012-06-29 | Discharge: 2012-06-29 | Disposition: A | Discharge status: Home / Self Care | Payer: Self-pay | Attending: Emergency Medicine | Admitting: Emergency Medicine

## 2012-06-29 ENCOUNTER — Encounter (HOSPITAL_COMMUNITY): Payer: Self-pay | Admitting: Emergency Medicine

## 2012-06-29 DIAGNOSIS — Z79899 Other long term (current) drug therapy: Secondary | ICD-10-CM | POA: Insufficient documentation

## 2012-06-29 DIAGNOSIS — L290 Pruritus ani: Secondary | ICD-10-CM | POA: Insufficient documentation

## 2012-06-29 DIAGNOSIS — Z87891 Personal history of nicotine dependence: Secondary | ICD-10-CM | POA: Insufficient documentation

## 2012-06-29 DIAGNOSIS — Z87440 Personal history of urinary (tract) infections: Secondary | ICD-10-CM | POA: Insufficient documentation

## 2012-06-29 DIAGNOSIS — H409 Unspecified glaucoma: Secondary | ICD-10-CM | POA: Insufficient documentation

## 2012-06-29 MED ORDER — MEBENDAZOLE 100 MG PO CHEW: 100.0000 mg | CHEWABLE_TABLET | Freq: Once | ORAL | Status: DC

## 2012-06-29 NOTE — ED Provider Notes (Signed)
I personally performed the services described in this documentation, which was scribed in my presence. The recorded information has been reviewed and is accurate.  Isaly Fasching L Alethia Melendrez, MD 06/29/12 2322 

## 2012-06-29 NOTE — ED Notes (Signed)
Patient started having itching in the rectal area for about a month.  Patient not sure if it is hemorrhoids or pin worms.  Pt reports area around anus is irritated and sore.

## 2012-06-29 NOTE — ED Provider Notes (Signed)
History     CSN: [id number]  Arrival date & time 06/29/12  1831   First MD Initiated Contact with Patient 06/29/12 1931      Chief Complaint  Patient presents with  . Hemorrhoids    (Consider location/radiation/quality/duration/timing/severity/associated sxs/prior treatment) HPI Comments: 41 year old male presents emergency department with chief complaint of perianal rectal itching onset one month ago.  Patient reports that the area around his penis is sore from excoriations.  He currently lives in a boarding home and questions possible pinworms versus hemorrhoid.  He denies any pain with defecation or rectal bleeding.  Pruritus is worse in the morning.  No other complaints at this time.  Patient denies fevers night sweats or chills.  The history is provided by the patient.    Past Medical History  Diagnosis Date  . Glaucoma(365)   . UTI (lower urinary tract infection)     Past Surgical History  Procedure Date  . Eye surgery   . Lasik     No family history on file.  History  Substance Use Topics  . Smoking status: Former Games developer  . Smokeless tobacco: Not on file  . Alcohol Use: No      Review of Systems  Constitutional: Negative for fever, diaphoresis and activity change.  HENT: Negative for congestion and neck pain.   Respiratory: Negative for cough.   Genitourinary: Negative for dysuria.  Musculoskeletal: Negative for myalgias.  Skin: Negative for color change and wound.  Neurological: Negative for headaches.  All other systems reviewed and are negative.    Allergies  Review of patient's allergies indicates no known allergies.  Home Medications   Current Outpatient Rx  Name  Route  Sig  Dispense  Refill  . BIMATOPROST 0.03 % OP SOLN   Both Eyes   Place 1 drop into both eyes at bedtime.         . OMEGA-3 FATTY ACIDS 1000 MG PO CAPS   Oral   Take 1 g by mouth daily.         . COLON CLEANSER PO   Oral   Take 3 tablets by mouth daily.             BP 132/80  Pulse 89  Temp 98.2 F (36.8 C) (Oral)  Resp 20  Wt 200 lb (90.719 kg)  SpO2 98%  Physical Exam  Nursing note and vitals reviewed. Constitutional: He is oriented to person, place, and time. He appears well-developed and well-nourished. No distress.  HENT:  Head: Normocephalic and atraumatic.  Eyes: Conjunctivae normal and EOM are normal.  Neck: Normal range of motion.  Pulmonary/Chest: Effort normal.  Genitourinary: Rectum normal.       Exam chaperoned.  No evidence of external or internal hemorrhoids.  No visible pinworms.  Perianal skin normal.  Musculoskeletal: Normal range of motion.  Neurological: He is alert and oriented to person, place, and time.  Skin: Skin is warm and dry. No rash noted. He is not diaphoretic.  Psychiatric: He has a normal mood and affect. His behavior is normal.    ED Course  Procedures (including critical care time)  Labs Reviewed - No data to display No results found.   No diagnosis found.    MDM  41 year old male presents emergency department complaining of perianal pruritus.  She reports concern for pinworms because he currently lives in a boarding home and elderly all also had pinworms.  Will treat with Vermox as this is a possible diagnosis.  No  evidence of hemorrhoids at this time.  At this time there does not appear to be any evidence of an acute emergency medical condition and the patient appears stable for discharge with appropriate outpatient follow up.Diagnosis was discussed with patient who verbalizes understanding and is agreeable to discharge.         Jaci Carrel, [place] 06/29/12 2047

## 2013-02-10 ENCOUNTER — Encounter: Payer: Self-pay | Admitting: Internal Medicine

## 2013-02-10 ENCOUNTER — Ambulatory Visit: Payer: No Typology Code available for payment source | Attending: Family Medicine | Admitting: Internal Medicine

## 2013-02-10 VITALS — BP 125/86 | HR 84 | Resp 16 | Ht 70.0 in | Wt 213.0 lb

## 2013-02-10 DIAGNOSIS — K029 Dental caries, unspecified: Secondary | ICD-10-CM | POA: Insufficient documentation

## 2013-02-10 DIAGNOSIS — Z79899 Other long term (current) drug therapy: Secondary | ICD-10-CM | POA: Insufficient documentation

## 2013-02-10 DIAGNOSIS — H5710 Ocular pain, unspecified eye: Secondary | ICD-10-CM | POA: Insufficient documentation

## 2013-02-10 DIAGNOSIS — K089 Disorder of teeth and supporting structures, unspecified: Secondary | ICD-10-CM | POA: Insufficient documentation

## 2013-02-10 DIAGNOSIS — H409 Unspecified glaucoma: Secondary | ICD-10-CM

## 2013-02-10 NOTE — Progress Notes (Signed)
Patient ID: [name], male   DOB: 11-07-1970, 42 y.o.   MRN: [id number] Patient Demographics  Marvin Lam, is a 42 y.o. male  FAO:[id number]  ONG:[id number]  DOB - [birth year]  Chief Complaint  Patient presents with  . Eye Pain  . Dental Pain        Subjective:   Marvin Lam today is here to establish primary care. Patient has a  Past Medical History  Diagnosis Date  . Glaucoma   . UTI (lower urinary tract infection)   . Patient has no significant past medical history except for glaucoma, he presents to the clinic to establish care, he claims that he has pain "behind his eyes" intermittently for the past 2 years. He recently saw his ophthalmologist approximately 2 weeks ago (M.D. at [facility])and was told that the pain was not coming from his eye, and he needed to establish with a family practitioner for further continued care. He claims that the pain "behind his eyes"is only 3/10 at its worst, with no radiation, no visual symptoms, no fever no neck stiffness. He has this for approximately 2 years. He also complains of right lower tooth pain for the past 2 years as well, and has been chewing more than on his left side. Patient has also has No headache, No chest pain, No abdominal pain,No Nausea, No new weakness tingling or numbness, No Cough or SOB.   Objective:    Filed Vitals:   02/10/13 1548  BP: 125/86  Pulse: 84  Resp: 16  Height: 5\' 10"  (1.778 m)  Weight: 213 lb (96.616 kg)  SpO2: 98%     ALLERGIES:  No Known Allergies  PAST MEDICAL HISTORY: Past Medical History  Diagnosis Date  . Glaucoma   . UTI (lower urinary tract infection)     PAST SURGICAL HISTORY: Past Surgical History  Procedure Laterality Date  . Eye surgery    . Lasik      FAMILY HISTORY: No family history on file.  MEDICATIONS AT HOME: Prior to Admission medications   Medication Sig Start Date End Date Taking? Authorizing Provider  bimatoprost (LUMIGAN) 0.03 % ophthalmic  solution Place 1 drop into both eyes at bedtime.   Yes Historical Provider, MD  fish oil-omega-3 fatty acids 1000 MG capsule Take 1 g by mouth daily.   Yes Historical Provider, MD  mebendazole (VERMOX) 100 MG chewable tablet Chew 1 tablet (100 mg total) by mouth once. repeat in 2-3 wk if needed 06/29/12   Jaci Carrel, PA-C  Misc Natural Products (COLON CLEANSER PO) Take 3 tablets by mouth daily.    Historical Provider, MD    SOCIAL HISTORY:   reports that he has quit smoking. He does not have any smokeless tobacco history on file. He reports that he does not drink alcohol or use illicit drugs.  REVIEW OF SYSTEMS:  Constitutional:   No   Fevers, chills, fatigue.  HEENT:    No headaches, Sore throat,   Cardio-vascular: No chest pain,  Orthopnea, swelling in lower extremities, anasarca, palpitations  GI:  No abdominal pain, nausea, vomiting, diarrhea  Resp: No shortness of breath,  No coughing up of blood.No cough.No wheezing.  Skin:  no rash or lesions.  GU:  no dysuria, change in color of urine, no urgency or frequency.  No flank pain.  Musculoskeletal: No joint pain or swelling.  No decreased range of motion.  No back pain.  Psych: No change in mood or affect. No depression or anxiety.  No  memory loss.   Exam  General appearance :Awake, alert, not in any distress. Speech Clear. Not toxic Looking HEENT: Atraumatic and Normocephalic, pupils equally reactive to light and accomodation Neck: supple, no JVD. No cervical lymphadenopathy.  Chest:Good air entry bilaterally, no added sounds  CVS: S1 S2 regular, no murmurs.  Abdomen: Bowel sounds present, Non tender and not distended with no gaurding, rigidity or rebound. Extremities: B/L Lower Ext shows no edema, both legs are warm to touch Neurology: Awake alert, and oriented X 3, CN II-XII intact, Non focal Skin:No Rash Wounds:N/A    Data Review   CBC No results found for this basename: WBC, HGB, HCT, PLT, MCV, MCH,  MCHC, RDW, NEUTRABS, LYMPHSABS, MONOABS, EOSABS, BASOSABS, BANDABS, BANDSABD,  in the last 168 hours  Chemistries   No results found for this basename: NA, K, CL, CO2, GLUCOSE, BUN, CREATININE, GFRCGP, CALCIUM, MG, AST, ALT, ALKPHOS, BILITOT,  in the last 168 hours ------------------------------------------------------------------------------------------------------------------ No results found for this basename: HGBA1C,  in the last 72 hours ------------------------------------------------------------------------------------------------------------------ No results found for this basename: CHOL, HDL, LDLCALC, TRIG, CHOLHDL, LDLDIRECT,  in the last 72 hours ------------------------------------------------------------------------------------------------------------------ No results found for this basename: TSH, T4TOTAL, FREET3, T3FREE, THYROIDAB,  in the last 72 hours ------------------------------------------------------------------------------------------------------------------ No results found for this basename: VITAMINB12, FOLATE, FERRITIN, TIBC, IRON, RETICCTPCT,  in the last 72 hours  Coagulation profile  No results found for this basename: INR, PROTIME,  in the last 168 hours    Assessment & Plan   Dental caries - Likely causing the right-sided tooth pain- refer to dentistry  "Pain behind his eyes" -?etiology - Will get basic labs, x-rays of his sinuses- and have him followup in 3 weeks  Gen. Health maintenance - Starting off with routine labs, further health maintenance will be done on subsequent visits.  Please followup in 3 weeks, hopefully he will have his labs and x-rays prior to his next visit

## 2013-02-10 NOTE — Progress Notes (Signed)
Pt here with c/o sinus pressure radiating behind eyes and temporal pain x 1 week. Hx glaucoma and taking prescribed eye drops. Vss. Also c/o left mouth tooth ache with eating

## 2013-02-19 ENCOUNTER — Ambulatory Visit: Payer: No Typology Code available for payment source

## 2013-03-03 ENCOUNTER — Ambulatory Visit: Payer: No Typology Code available for payment source

## 2013-07-28 ENCOUNTER — Encounter (HOSPITAL_COMMUNITY): Payer: Self-pay | Admitting: Emergency Medicine

## 2013-07-28 ENCOUNTER — Emergency Department (HOSPITAL_COMMUNITY)
Admission: EM | Admit: 2013-07-28 | Discharge: 2013-07-28 | Disposition: A | Discharge status: Home / Self Care | Payer: BC Managed Care – PPO | Attending: Emergency Medicine | Admitting: Emergency Medicine

## 2013-07-28 DIAGNOSIS — H409 Unspecified glaucoma: Secondary | ICD-10-CM | POA: Insufficient documentation

## 2013-07-28 DIAGNOSIS — K3189 Other diseases of stomach and duodenum: Secondary | ICD-10-CM | POA: Insufficient documentation

## 2013-07-28 DIAGNOSIS — Z79899 Other long term (current) drug therapy: Secondary | ICD-10-CM | POA: Insufficient documentation

## 2013-07-28 DIAGNOSIS — R220 Localized swelling, mass and lump, head: Secondary | ICD-10-CM | POA: Insufficient documentation

## 2013-07-28 DIAGNOSIS — Z87891 Personal history of nicotine dependence: Secondary | ICD-10-CM | POA: Insufficient documentation

## 2013-07-28 DIAGNOSIS — N390 Urinary tract infection, site not specified: Secondary | ICD-10-CM | POA: Insufficient documentation

## 2013-07-28 DIAGNOSIS — K219 Gastro-esophageal reflux disease without esophagitis: Secondary | ICD-10-CM | POA: Insufficient documentation

## 2013-07-28 MED ORDER — OMEPRAZOLE 20 MG PO CPDR: 20.0000 mg | DELAYED_RELEASE_CAPSULE | Freq: Every day | ORAL | Status: DC

## 2013-07-28 NOTE — ED Notes (Signed)
Pt reports eating some chicken a week ago and now feels as if something is stuck in his throat as well as feeling indigestion and acid after he eats and sinus drainage. Pt denies fever or coughing.

## 2013-07-28 NOTE — ED Notes (Signed)
Pt presents with c/o drainage and he feels like he has heartburn whenever he eats something.

## 2013-07-28 NOTE — ED Provider Notes (Signed)
CSN: [id number]     Arrival date & time 07/28/13  1725 History  This chart was scribed for non-physician practitioner, Antony Madura, PA-C working with Nelia Shi, MD by Greggory Stallion, ED scribe. This patient was seen in room WTR7/WTR7 and the patient's care was started at 8:20 PM.   Chief Complaint  Patient presents with  . Gastrophageal Reflux   The history is provided by the patient. No language interpreter was used.   HPI Comments: Marvin Lam is a 42 y.o. male who presents to the Emergency Department complaining of acid reflux and indigestion that started 4-5 days ago. Pt states he has felt a lump in his throat for about 2 days. He states he has not been diagnosed with acid reflux but "deals with it" when it happens. Pt has taken Nexium with some relief. Denies congestion, trouble swallowing, drooling, SOB, fever, chest pain, abdominal pain.    Past Medical History  Diagnosis Date  . Glaucoma   . UTI (lower urinary tract infection)    Past Surgical History  Procedure Laterality Date  . Eye surgery    . Lasik     History reviewed. No pertinent family history. History  Substance Use Topics  . Smoking status: Former Games developer  . Smokeless tobacco: Not on file  . Alcohol Use: No    Review of Systems  Constitutional: Negative for fever.  HENT: Negative for congestion, drooling and trouble swallowing.   Respiratory: Negative for shortness of breath.   Gastrointestinal: Negative for abdominal pain.       Positive for indigestion.   All other systems reviewed and are negative.    Allergies  Review of patient's allergies indicates no known allergies.  Home Medications   Current Outpatient Rx  Name  Route  Sig  Dispense  Refill  . bimatoprost (LUMIGAN) 0.03 % ophthalmic solution   Both Eyes   Place 1 drop into both eyes at bedtime.         Marland Kitchen esomeprazole (NEXIUM) 40 MG capsule   Oral   Take 40 mg by mouth daily at 12 noon.         Marland Kitchen omeprazole (PRILOSEC) 20  MG capsule   Oral   Take 1 capsule (20 mg total) by mouth daily.   30 capsule   0    BP 118/74  Pulse 80  Temp(Src) 98.7 F (37.1 C) (Oral)  Resp 15  Ht 5\' 10"  (1.778 m)  Wt 210 lb (95.255 kg)  BMI 30.13 kg/m2  SpO2 98%  Physical Exam  Nursing note and vitals reviewed. Constitutional: He is oriented to person, place, and time. He appears well-developed and well-nourished. No distress.  HENT:  Head: Normocephalic and atraumatic.  Mouth/Throat: Uvula is midline and oropharynx is clear and moist. No oropharyngeal exudate.  Airway patent. Pt tolerating secretions without difficulty.   Eyes: Conjunctivae and EOM are normal. Pupils are equal, round, and reactive to light. No scleral icterus.  Neck: Normal range of motion. Neck supple.  Cardiovascular: Normal rate, regular rhythm and normal heart sounds.   Pulmonary/Chest: Effort normal and breath sounds normal. No stridor. No respiratory distress. He has no wheezes. He has no rhonchi. He has no rales.  Abdominal: Soft. There is no tenderness.  Musculoskeletal: Normal range of motion.  Lymphadenopathy:    He has no cervical adenopathy.  Neurological: He is alert and oriented to person, place, and time.  Skin: Skin is warm and dry. No rash noted. He is not  diaphoretic. No erythema. No pallor.  Psychiatric: He has a normal mood and affect. His behavior is normal.    ED Course  Procedures (including critical care time)  DIAGNOSTIC STUDIES: Oxygen Saturation is 98% on RA, normal by my interpretation.    COORDINATION OF CARE: 8:25 PM-Discussed treatment plan which includes Prilosec prescription and following up with a PCP with pt at bedside and pt agreed to plan.   Labs Review Labs Reviewed - No data to display Imaging Review No results found.  EKG Interpretation   None       MDM   1. Reflux    Uncomplicated esophageal reflux. Patient well and nontoxic appearing, hemodynamically stable, and afebrile. Airway patent and  patient tolerating secretions without difficulty. No stridor. Heart RRR and lungs CTAB. Abdomen soft and nontender. Patient stable for d/c with f/u with [facility]. Will give Rx for Prilosec and info on diet for reflux. Return precautions discussed and patient agreeable to plan with no unaddressed concerns.  I personally performed the services described in this documentation, which was scribed in my presence. The recorded information has been reviewed and is accurate.    Antony Madura, PA-C 07/28/13 2031

## 2013-07-31 NOTE — ED Provider Notes (Signed)
Medical screening examination/treatment/procedure(s) were performed by non-physician practitioner and as supervising physician I was immediately available for consultation/collaboration.   [name], MD 07/31/13 [phone]

## 2013-08-31 ENCOUNTER — Encounter (HOSPITAL_COMMUNITY): Payer: Self-pay | Admitting: Emergency Medicine

## 2013-08-31 ENCOUNTER — Emergency Department (HOSPITAL_COMMUNITY)
Admission: EM | Admit: 2013-08-31 | Discharge: 2013-08-31 | Disposition: A | Discharge status: Home / Self Care | Payer: BC Managed Care – PPO | Attending: Emergency Medicine | Admitting: Emergency Medicine

## 2013-08-31 DIAGNOSIS — Z79899 Other long term (current) drug therapy: Secondary | ICD-10-CM | POA: Insufficient documentation

## 2013-08-31 DIAGNOSIS — Z8669 Personal history of other diseases of the nervous system and sense organs: Secondary | ICD-10-CM | POA: Insufficient documentation

## 2013-08-31 DIAGNOSIS — Z87440 Personal history of urinary (tract) infections: Secondary | ICD-10-CM | POA: Insufficient documentation

## 2013-08-31 DIAGNOSIS — J329 Chronic sinusitis, unspecified: Secondary | ICD-10-CM

## 2013-08-31 MED ORDER — AZITHROMYCIN 250 MG PO TABS: ORAL_TABLET | ORAL | Status: DC

## 2013-08-31 NOTE — ED Notes (Addendum)
Pt reports having a headache and sinus pressure that is behind his eyes, between his eyes, and in the temporal region. Pt reports that he went to an ophthalmologist because he was concerned due to his history of glaucoma, which he has had for 15 years. The patient reports that the ophthalmologist checked his eye pressure and stated there wasn't any concerning symptoms and also to follow up and be evaluated for a sinus infection. Pt reports nasal congestion, which is worse at night. Pt is A/O x4, in NAD, and vital signs are WDL.

## 2013-08-31 NOTE — ED Provider Notes (Signed)
CSN: [id number]     Arrival date & time 08/31/13  1637 History  This chart was scribed for non-physician practitioner, Margarita Mail, PA-C working with Hoy Morn, MD by Frederich Balding, ED scribe. This patient was seen in room WTR7/WTR7 and the patient's care was started at 5:17 PM.   Chief Complaint  Patient presents with  . Headache   The history is provided by the patient. No language interpreter was used.   HPI Comments: Marvin Lam is a 43 y.o. male who presents to the Emergency Department complaining of gradual onset, constant, pressure headache behind his eyes and his ethmoid sinus that started 2 weeks ago. Rates the headache 5/10. Pt states he went to his eye doctor to make sure it had nothing to do with his glaucoma. He has taken sinus medication with no relief. Denies fever, nasal drainage, visual disturbance, nausea, emesis, sore throat, cough, ear pressure, ear pain. Denies photophobia, phonophobia, UL throbbing, N/V, visual changes, stiff neck, neck pain, rash, or "thunderclap" onset.   Past Medical History  Diagnosis Date  . Glaucoma   . UTI (lower urinary tract infection)    Past Surgical History  Procedure Laterality Date  . Eye surgery    . Lasik     No family history on file. History  Substance Use Topics  . Smoking status: Former Research scientist (life sciences)  . Smokeless tobacco: Not on file  . Alcohol Use: No    Review of Systems  Constitutional: Negative for fever.  HENT: Negative for ear pain, rhinorrhea and sore throat.   Eyes: Negative for visual disturbance.  Respiratory: Negative for cough.   Cardiovascular: Negative for chest pain.  Gastrointestinal: Negative for nausea and vomiting.  Musculoskeletal: Negative for gait problem.  Skin: Negative for rash.  Neurological: Positive for headaches.  Psychiatric/Behavioral: Negative for confusion.    Allergies  Review of patient's allergies indicates no known allergies.  Home Medications   Current Outpatient Rx   Name  Route  Sig  Dispense  Refill  . bimatoprost (LUMIGAN) 0.03 % ophthalmic solution   Both Eyes   Place 1 drop into both eyes at bedtime.         Marland Kitchen esomeprazole (NEXIUM) 40 MG capsule   Oral   Take 40 mg by mouth daily at 12 noon.         Marland Kitchen omeprazole (PRILOSEC) 20 MG capsule   Oral   Take 1 capsule (20 mg total) by mouth daily.   30 capsule   0    BP 121/78  Pulse 69  Temp(Src) 98 F (36.7 C) (Oral)  Resp 18  SpO2 98%  Physical Exam  Nursing note and vitals reviewed. Constitutional: He is oriented to person, place, and time. He appears well-developed and well-nourished. No distress.  HENT:  Head: Normocephalic and atraumatic.  Eyes: EOM are normal.  No pain with eye movement.   Neck: Neck supple. No tracheal deviation present.  Cardiovascular: Normal rate.   Pulmonary/Chest: Effort normal. No respiratory distress.  Musculoskeletal: Normal range of motion.  Neurological: He is alert and oriented to person, place, and time.  Skin: Skin is warm and dry.  Psychiatric: He has a normal mood and affect. His behavior is normal.    ED Course  Procedures (including critical care time)  DIAGNOSTIC STUDIES: Oxygen Saturation is 98% on RA, normal by my interpretation.    COORDINATION OF CARE: 5:20 PM-Discussed treatment plan which includes an antibiotic with pt at bedside and pt agreed to  plan.   Labs Review Labs Reviewed - No data to display Imaging Review No results found.  EKG Interpretation   None       MDM   1. Sinusitis    Patient with 14 days of sxs of headache and sinus pressure.  Glaucoma r/o as possibility by patient's eye doctor. Will d/c with azithromycin. HA non concerning for [facility], ICH, Meningitis, or temporal arteritis. Pt is afebrile with no focal neuro deficits, nuchal rigidity, or change in vision. Pt is to follow up with PCP to discuss prophylactic medication. Pt verbalizes understanding and is agreeable with plan to dc.   I  personally performed the services described in this documentation, which was scribed in my presence. The recorded information has been reviewed and is accurate.      Margarita Mail, PA-C 09/01/13 [phone]

## 2013-08-31 NOTE — Discharge Instructions (Signed)
Do not drive, operate heavy machinery, drink alcohol, or take other tylenol containing products with this medicine.  Sinusitis Sinusitis is redness, soreness, and swelling (inflammation) of the paranasal sinuses. Paranasal sinuses are air pockets within the bones of your face (beneath the eyes, the middle of the forehead, or above the eyes). In healthy paranasal sinuses, mucus is able to drain out, and air is able to circulate through them by way of your nose. However, when your paranasal sinuses are inflamed, mucus and air can become trapped. This can allow bacteria and other germs to grow and cause infection. Sinusitis can develop quickly and last only a short time (acute) or continue over a long period (chronic). Sinusitis that lasts for more than 12 weeks is considered chronic.  CAUSES  Causes of sinusitis include:  Allergies.  Structural abnormalities, such as displacement of the cartilage that separates your nostrils (deviated septum), which can decrease the air flow through your nose and sinuses and affect sinus drainage.  Functional abnormalities, such as when the small hairs (cilia) that line your sinuses and help remove mucus do not work properly or are not present. SYMPTOMS  Symptoms of acute and chronic sinusitis are the same. The primary symptoms are pain and pressure around the affected sinuses. Other symptoms include:  Upper toothache.  Earache.  Headache.  Bad breath.  Decreased sense of smell and taste.  A cough, which worsens when you are lying flat.  Fatigue.  Fever.  Thick drainage from your nose, which often is green and may contain pus (purulent).  Swelling and warmth over the affected sinuses. DIAGNOSIS  Your caregiver will perform a physical exam. During the exam, your caregiver may:  Look in your nose for signs of abnormal growths in your nostrils (nasal polyps).  Tap over the affected sinus to check for signs of infection.  View the inside of your  sinuses (endoscopy) with a special imaging device with a light attached (endoscope), which is inserted into your sinuses. If your caregiver suspects that you have chronic sinusitis, one or more of the following tests may be recommended:  Allergy tests.  Nasal culture A sample of mucus is taken from your nose and sent to a lab and screened for bacteria.  Nasal cytology A sample of mucus is taken from your nose and examined by your caregiver to determine if your sinusitis is related to an allergy. TREATMENT  Most cases of acute sinusitis are related to a viral infection and will resolve on their own within 10 days. Sometimes medicines are prescribed to help relieve symptoms (pain medicine, decongestants, nasal steroid sprays, or saline sprays).  However, for sinusitis related to a bacterial infection, your caregiver will prescribe antibiotic medicines. These are medicines that will help kill the bacteria causing the infection.  Rarely, sinusitis is caused by a fungal infection. In theses cases, your caregiver will prescribe antifungal medicine. For some cases of chronic sinusitis, surgery is needed. Generally, these are cases in which sinusitis recurs more than 3 times per year, despite other treatments. HOME CARE INSTRUCTIONS   Drink plenty of water. Water helps thin the mucus so your sinuses can drain more easily.  Use a humidifier.  Inhale steam 3 to 4 times a day (for example, sit in the bathroom with the shower running).  Apply a warm, moist washcloth to your face 3 to 4 times a day, or as directed by your caregiver.  Use saline nasal sprays to help moisten and clean your sinuses.  Take  over-the-counter or prescription medicines for pain, discomfort, or fever only as directed by your caregiver. SEEK IMMEDIATE MEDICAL CARE IF:  You have increasing pain or severe headaches.  You have nausea, vomiting, or drowsiness.  You have swelling around your face.  You have vision  problems.  You have a stiff neck.  You have difficulty breathing. MAKE SURE YOU:   Understand these instructions.  Will watch your condition.  Will get help right away if you are not doing well or get worse. Document Released: 07/17/2005 Document Revised: 10/09/2011 Document Reviewed: 08/01/2011 [facility] Patient Information 2014 [place], [place].

## 2013-09-02 NOTE — ED Provider Notes (Signed)
Medical screening examination/treatment/procedure(s) were performed by non-physician practitioner and as supervising physician I was immediately available for consultation/collaboration.  EKG Interpretation   None         Hoy Morn, MD 09/02/13 [phone]

## 2013-12-10 ENCOUNTER — Emergency Department (HOSPITAL_COMMUNITY)
Admission: EM | Admit: 2013-12-10 | Discharge: 2013-12-10 | Disposition: A | Discharge status: Home / Self Care | Payer: BC Managed Care – PPO | Attending: Emergency Medicine | Admitting: Emergency Medicine

## 2013-12-10 ENCOUNTER — Encounter (HOSPITAL_COMMUNITY): Payer: Self-pay | Admitting: Emergency Medicine

## 2013-12-10 DIAGNOSIS — Z87891 Personal history of nicotine dependence: Secondary | ICD-10-CM | POA: Insufficient documentation

## 2013-12-10 DIAGNOSIS — Z792 Long term (current) use of antibiotics: Secondary | ICD-10-CM | POA: Insufficient documentation

## 2013-12-10 DIAGNOSIS — J320 Chronic maxillary sinusitis: Secondary | ICD-10-CM | POA: Insufficient documentation

## 2013-12-10 DIAGNOSIS — Z87440 Personal history of urinary (tract) infections: Secondary | ICD-10-CM | POA: Insufficient documentation

## 2013-12-10 MED ORDER — AMOXICILLIN-POT CLAVULANATE 875-125 MG PO TABS: 1.0000 | ORAL_TABLET | Freq: Two times a day (BID) | ORAL | Status: DC

## 2013-12-10 MED ORDER — HYDROCOD POLST-CHLORPHEN POLST 10-8 MG/5ML PO LQCR
5.0000 mL | Freq: Once | ORAL | Status: AC
  Administered 2013-12-10: 5 mL via ORAL
  Filled 2013-12-10: qty 5

## 2013-12-10 MED ORDER — PROMETHAZINE-CODEINE 6.25-10 MG/5ML PO SYRP: 10.0000 mL | ORAL_SOLUTION | Freq: Four times a day (QID) | ORAL | Status: DC | PRN

## 2013-12-10 MED ORDER — AMOXICILLIN-POT CLAVULANATE 875-125 MG PO TABS
1.0000 | ORAL_TABLET | Freq: Once | ORAL | Status: AC
  Administered 2013-12-10: 1 via ORAL
  Filled 2013-12-10: qty 1

## 2013-12-10 NOTE — Discharge Instructions (Signed)
Sinusitis Sinusitis is redness, soreness, and puffiness (inflammation) of the air pockets in the bones of your face (sinuses). The redness, soreness, and puffiness can cause air and mucus to get trapped in your sinuses. This can allow germs to grow and cause an infection.  HOME CARE   Drink enough fluids to keep your pee (urine) clear or pale yellow.  Use a humidifier in your home.  Run a hot shower to create steam in the bathroom. Sit in the bathroom with the door closed. Breathe in the steam 3 4 times a day.  Put a warm, moist washcloth on your face 3 4 times a day, or as told by your doctor.  Use salt water sprays (saline sprays) to wet the thick fluid in your nose. This can help the sinuses drain.  Only take medicine as told by your doctor. GET HELP RIGHT AWAY IF:   Your pain gets worse.  You have very bad headaches.  You are sick to your stomach (nauseous).  You throw up (vomit).  You are very sleepy (drowsy) all the time.  Your face is puffy (swollen).  Your vision changes.  You have a stiff neck.  You have trouble breathing. MAKE SURE YOU:   Understand these instructions.  Will watch your condition.  Will get help right away if you are not doing well or get worse. Document Released: 01/03/2008 Document Revised: 04/10/2012 Document Reviewed: 02/20/2012 [facility] Patient Information 2014 [place].   Antibiotic and cough syrup prescription. Also recommend Claritin daily. Increase fluids.

## 2013-12-10 NOTE — ED Provider Notes (Signed)
CSN: [id number]     Arrival date & time 12/10/13  0319 History   First MD Initiated Contact with Patient 12/10/13 0330     Chief Complaint  Patient presents with  . Cough     (Consider location/radiation/quality/duration/timing/severity/associated sxs/prior Treatment) HPI.... cough, congestion, left maxillary sinus congestion for 7 days. Patient tried over-the-counter products with minimal relief. No fever, chills, stiff neck. Severity is moderate.  Past Medical History  Diagnosis Date  . Glaucoma   . UTI (lower urinary tract infection)    Past Surgical History  Procedure Laterality Date  . Eye surgery    . Lasik     History reviewed. No pertinent family history. History  Substance Use Topics  . Smoking status: Former Research scientist (life sciences)  . Smokeless tobacco: Not on file  . Alcohol Use: No    Review of Systems  All other systems reviewed and are negative.     Allergies  Review of patient's allergies indicates no known allergies.  Home Medications   Prior to Admission medications   Medication Sig Start Date End Date Taking? Authorizing Provider  bimatoprost (LUMIGAN) 0.03 % ophthalmic solution Place 1 drop into both eyes at bedtime.   Yes Historical Provider, MD  guaiFENesin (MUCINEX) 600 MG 12 hr tablet Take 600 mg by mouth 2 (two) times daily as needed (congestion).   Yes Historical Provider, MD  amoxicillin-clavulanate (AUGMENTIN) 875-125 MG per tablet Take 1 tablet by mouth 2 (two) times daily. 12/10/13   Nat Christen, MD  promethazine-codeine [facility] WITH CODEINE) 6.25-10 MG/5ML syrup Take 10 mLs by mouth every 6 (six) hours as needed for cough. 12/10/13   Nat Christen, MD   BP 116/78  Pulse 77  Temp(Src) 98.1 F (36.7 C) (Oral)  Resp 18  Ht 5\' 11"  (1.803 m)  Wt 213 lb (96.616 kg)  BMI 29.72 kg/m2  SpO2 100% Physical Exam  Nursing note and vitals reviewed. Constitutional: He is oriented to person, place, and time. He appears well-developed and well-nourished.  HENT:   Head: Normocephalic and atraumatic.  Tender over left maxillary sinus  Eyes: Conjunctivae and EOM are normal. Pupils are equal, round, and reactive to light.  Neck: Normal range of motion. Neck supple.  Cardiovascular: Normal rate, regular rhythm and normal heart sounds.   Pulmonary/Chest: Effort normal and breath sounds normal.  Abdominal: Soft. Bowel sounds are normal.  Musculoskeletal: Normal range of motion.  Neurological: He is alert and oriented to person, place, and time.  Skin: Skin is warm and dry.  Psychiatric: He has a normal mood and affect. His behavior is normal.    ED Course  Procedures (including critical care time) Labs Review Labs Reviewed - No data to display  Imaging Review No results found.   EKG Interpretation None      MDM   Final diagnoses:  Left maxillary sinusitis    Patient is nontoxic. He is tender over the left maxillary sinus. Rx Augmentin twice a day for 10 days. Also Phenergan with codeine cough syrup    Nat Christen, MD 12/10/13 [phone]

## 2013-12-10 NOTE — ED Notes (Signed)
Pt reports a cough starting one week ago with nasal congestion. Pt states cough was productive but now it is gone and that cough is just lingering around. Pt reports using cough drops and OTC meds to help but no relief. Pt alert and oriented.

## 2013-12-24 ENCOUNTER — Encounter (HOSPITAL_COMMUNITY): Payer: Self-pay | Admitting: Emergency Medicine

## 2013-12-24 ENCOUNTER — Emergency Department (HOSPITAL_COMMUNITY)
Admission: EM | Admit: 2013-12-24 | Discharge: 2013-12-24 | Disposition: A | Discharge status: Home / Self Care | Payer: BC Managed Care – PPO | Attending: Emergency Medicine | Admitting: Emergency Medicine

## 2013-12-24 DIAGNOSIS — Z87891 Personal history of nicotine dependence: Secondary | ICD-10-CM | POA: Insufficient documentation

## 2013-12-24 DIAGNOSIS — Z792 Long term (current) use of antibiotics: Secondary | ICD-10-CM | POA: Insufficient documentation

## 2013-12-24 DIAGNOSIS — G479 Sleep disorder, unspecified: Secondary | ICD-10-CM | POA: Insufficient documentation

## 2013-12-24 DIAGNOSIS — R059 Cough, unspecified: Secondary | ICD-10-CM

## 2013-12-24 DIAGNOSIS — R05 Cough: Secondary | ICD-10-CM

## 2013-12-24 DIAGNOSIS — Z8669 Personal history of other diseases of the nervous system and sense organs: Secondary | ICD-10-CM | POA: Insufficient documentation

## 2013-12-24 DIAGNOSIS — Z87440 Personal history of urinary (tract) infections: Secondary | ICD-10-CM | POA: Insufficient documentation

## 2013-12-24 MED ORDER — SALINE SPRAY 0.65 % NA SOLN: 1.0000 | NASAL | Status: DC | PRN

## 2013-12-24 MED ORDER — SALINE SPRAY 0.65 % NA SOLN
1.0000 | Freq: Once | NASAL | Status: AC
  Administered 2013-12-24: 1 via NASAL
  Filled 2013-12-24: qty 44

## 2013-12-24 MED ORDER — CETIRIZINE HCL 10 MG PO TABS: 10.0000 mg | ORAL_TABLET | Freq: Every day | ORAL | Status: DC

## 2013-12-24 NOTE — Discharge Instructions (Signed)

## 2013-12-24 NOTE — ED Notes (Signed)
Pt states that he had a "cold" about 3 weeks ago and was seen in ED and took abx. Pt states now he gets a tickle in his throat and gets a cough which is worse at night. Pt denies pain. Pt alert, no acute distress.

## 2013-12-24 NOTE — ED Provider Notes (Signed)
CSN: [id number]     Arrival date & time 12/24/13  2056 History  This chart was scribed for non-physician practitioner Montine Circle, PA-C working with Merryl Hacker, MD by Anastasia Pall, ED scribe. This patient was seen in room WTR7/WTR7 and the patient's care was started at 10:09 PM.   Chief Complaint  Patient presents with  . Cough   (Consider location/radiation/quality/duration/timing/severity/associated sxs/prior Treatment) The history is provided by the patient. No language interpreter was used.   HPI Comments: Marvin Lam is a 43 y.o. male who presents to the Emergency Department with a chief complaint of an unproductive cough, onset 1 month ago. He reports being seen 3 weeks ago for cold symptoms where he was given Augmentin. He reports persistent cough since then. He reports his cough is triggered by a tickling sensation in his throat. He reports his cough is worse in the evenings and has trouble sleeping secondary to his cough. He denies wheezing, and any other associated symptoms. He reports h/o allergies.   PCP - Angelica Chessman, MD  Past Medical History  Diagnosis Date  . Glaucoma   . UTI (lower urinary tract infection)    Past Surgical History  Procedure Laterality Date  . Eye surgery    . Lasik     No family history on file. History  Substance Use Topics  . Smoking status: Former Research scientist (life sciences)  . Smokeless tobacco: Not on file  . Alcohol Use: No    Review of Systems  Constitutional: Negative for fever.  Respiratory: Positive for cough. Negative for wheezing.   Psychiatric/Behavioral: Positive for sleep disturbance (secondary to cough).   Allergies  Review of patient's allergies indicates no known allergies.  Home Medications   Prior to Admission medications   Medication Sig Start Date End Date Taking? Authorizing Provider  amoxicillin-clavulanate (AUGMENTIN) 875-125 MG per tablet Take 1 tablet by mouth 2 (two) times daily. 12/10/13   Nat Christen, MD   bimatoprost (LUMIGAN) 0.03 % ophthalmic solution Place 1 drop into both eyes at bedtime.    Historical Provider, MD  guaiFENesin (MUCINEX) 600 MG 12 hr tablet Take 600 mg by mouth 2 (two) times daily as needed (congestion).    Historical Provider, MD  promethazine-codeine (PHENERGAN WITH CODEINE) 6.25-10 MG/5ML syrup Take 10 mLs by mouth every 6 (six) hours as needed for cough. 12/10/13   Nat Christen, MD   BP 108/70  Pulse 90  Temp(Src) 98.3 F (36.8 C) (Oral)  Resp 20  Ht 5\' 11"  (1.803 m)  Wt 215 lb (97.523 kg)  BMI 30.00 kg/m2  SpO2 98%  Physical Exam  Nursing note and vitals reviewed. Constitutional: He is oriented to person, place, and time. He appears well-developed and well-nourished. No distress.  HENT:  Head: Normocephalic and atraumatic.  Right Ear: External ear normal.  Left Ear: External ear normal.  Eyes: EOM are normal.  Neck: Neck supple.  Cardiovascular: Normal rate, regular rhythm and normal heart sounds.  Exam reveals no gallop and no friction rub.   No murmur heard. Pulmonary/Chest: Effort normal and breath sounds normal. No respiratory distress. He has no wheezes. He has no rales. He exhibits no tenderness.  Musculoskeletal: Normal range of motion.  Neurological: He is alert and oriented to person, place, and time.  Skin: Skin is warm and dry.  Psychiatric: He has a normal mood and affect. His behavior is normal.   ED Course  Procedures (including critical care time)  DIAGNOSTIC STUDIES: Oxygen Saturation is 98% on room  air, normal by my interpretation.    COORDINATION OF CARE: 10:12 PM-Discussed treatment plan which includes saline nasal spray and Zyrtec with pt at bedside and pt agreed to plan.   Labs Review Labs Reviewed - No data to display  Imaging Review No results found.   EKG Interpretation None     Medications - No data to display MDM   Final diagnoses:  Cough   Patients symptoms are consistent with URI, likely viral etiology.  Discussed that antibiotics are not indicated for viral infections. Pt will be discharged with symptomatic treatment.  Verbalizes understanding and is agreeable with plan. Pt is hemodynamically stable & in NAD prior to dc.   I personally performed the services described in this documentation, which was scribed in my presence. The recorded information has been reviewed and is accurate.    Montine Circle, PA-C 12/25/13 [phone]

## 2013-12-25 NOTE — ED Provider Notes (Signed)
Medical screening examination/treatment/procedure(s) were performed by non-physician practitioner and as supervising physician I was immediately available for consultation/collaboration.   EKG Interpretation None       Merryl Hacker, MD 12/25/13 [phone]

## 2014-01-06 ENCOUNTER — Telehealth: Payer: Self-pay | Admitting: *Deleted

## 2014-01-06 NOTE — Telephone Encounter (Signed)
Patient called stating he was seen in ER twice last month for a cough. Patient states it is a dry non productive cough. Patient states he is not short of breath. Asked patient has he been taking the Zyrtec prescription prescribed the ER. Patient states no. Informed patient to start taking the Zyrtec and try an OTC decongestant for one week, if that does not then come in to the office to be seen. Patient verbalized understanding. Alverda Skeans, RN

## 2014-01-19 ENCOUNTER — Emergency Department (HOSPITAL_COMMUNITY)
Admission: EM | Admit: 2014-01-19 | Discharge: 2014-01-19 | Discharge status: Absent without leave | Payer: Self-pay | Source: Home / Self Care

## 2014-02-11 ENCOUNTER — Ambulatory Visit: Payer: BC Managed Care – PPO | Admitting: Internal Medicine

## 2014-03-26 ENCOUNTER — Emergency Department (HOSPITAL_COMMUNITY)
Admission: EM | Admit: 2014-03-26 | Discharge: 2014-03-26 | Disposition: A | Discharge status: Home / Self Care | Payer: Self-pay | Attending: Emergency Medicine | Admitting: Emergency Medicine

## 2014-03-26 ENCOUNTER — Encounter (HOSPITAL_COMMUNITY): Payer: Self-pay | Admitting: Emergency Medicine

## 2014-03-26 DIAGNOSIS — Z87440 Personal history of urinary (tract) infections: Secondary | ICD-10-CM | POA: Insufficient documentation

## 2014-03-26 DIAGNOSIS — R059 Cough, unspecified: Secondary | ICD-10-CM | POA: Insufficient documentation

## 2014-03-26 DIAGNOSIS — Z87891 Personal history of nicotine dependence: Secondary | ICD-10-CM | POA: Insufficient documentation

## 2014-03-26 DIAGNOSIS — Z792 Long term (current) use of antibiotics: Secondary | ICD-10-CM | POA: Insufficient documentation

## 2014-03-26 DIAGNOSIS — Z79899 Other long term (current) drug therapy: Secondary | ICD-10-CM | POA: Insufficient documentation

## 2014-03-26 DIAGNOSIS — R05 Cough: Secondary | ICD-10-CM | POA: Insufficient documentation

## 2014-03-26 DIAGNOSIS — M546 Pain in thoracic spine: Secondary | ICD-10-CM | POA: Insufficient documentation

## 2014-03-26 DIAGNOSIS — M62838 Other muscle spasm: Secondary | ICD-10-CM

## 2014-03-26 DIAGNOSIS — M5380 Other specified dorsopathies, site unspecified: Secondary | ICD-10-CM | POA: Insufficient documentation

## 2014-03-26 MED ORDER — ALBUTEROL SULFATE HFA 108 (90 BASE) MCG/ACT IN AERS
2.0000 | INHALATION_SPRAY | Freq: Once | RESPIRATORY_TRACT | Status: AC
  Administered 2014-03-26: 2 via RESPIRATORY_TRACT
  Filled 2014-03-26: qty 6.7

## 2014-03-26 NOTE — ED Notes (Signed)
Pt reports upper back pain 6/10 for several months. Pt thinks he may be allergic to tomatoes and his ears keep getting stopped up.

## 2014-03-26 NOTE — Discharge Instructions (Signed)
Trigger Point Injection Trigger points are areas where you have muscle pain.  A trigger point might feel like a knot in your muscle. It hurts to press on a trigger point. Sometimes the pain spreads out (radiates) to other parts of the body. For example, pressing on a trigger point in your shoulder might cause pain in your arm or neck. You might have one trigger point. Or, you might have more than one. People often have trigger points in their upper back and lower back. They also occur often in the neck and shoulders. Pain from a trigger point lasts for a long time. It can make it hard to keep moving. You might not be able to do the exercise or physical therapy that could help you deal with the pain. A trigger point injection may help. It does not work for everyone. But, it may relieve your pain for a few days or a few months. A trigger point injection does not cure long-lasting (chronic) pain. LET YOUR CAREGIVER KNOW ABOUT:  Any allergies (especially to latex, lidocaine, or steroids).  Blood-thinning medicines that you take. These drugs can lead to bleeding or bruising after an injection. They include:  Aspirin.  Ibuprofen.  Clopidogrel.  Warfarin.  Other medicines you take. This includes all vitamins, herbs, eyedrops, over-the-counter medicines, and creams.  Use of steroids.  Recent infections.  Past problems with numbing medicines.  Bleeding problems.  Surgeries you have had.  Other health problems. RISKS AND COMPLICATIONS A trigger point injection is a safe treatment. However, problems may develop, such as:  Minor side effects usually go away in 1 to 2 days. These may include:  Soreness.  Bruising.  Stiffness.  More serious problems are rare. But, they may include:  Bleeding under the skin (hematoma).  Skin infection.  Breaking off of the needle under your skin.  Lung puncture.  The trigger point injection may not work for you. BEFORE THE PROCEDURE You may need  to stop taking any medicine that thins your blood. This is to prevent bleeding and bruising. Usually these medicines are stopped several days before the injection. No other preparation is needed. PROCEDURE  A trigger point injection can be given in your caregiver's office or in a clinic. Each injection takes 2 minutes or less.  Your caregiver will feel for trigger points. The caregiver may use a marker to circle the area for the injection.  The skin over the trigger point will be washed with a germ-killing (antiseptic) solution.  The caregiver pinches the spot for the injection.  Then, a very thin needle is used for the shot. You may feel pain or a twitching feeling when the needle enters the trigger point.  A numbing solution may be injected into the trigger point. Sometimes a drug to keep down swelling, redness, and warmth (inflammation) is also injected.  Your caregiver moves the needle around the trigger zone until the tightness and twitching goes away.  After the injection, your caregiver may put gentle pressure over the injection site.  Then it is covered with a bandage. AFTER THE PROCEDURE  You can go right home after the injection.  The bandage can be taken off after a few hours.  You may feel sore and stiff for 1 to 2 days.  Go back to your regular activities slowly. Your caregiver may ask you to stretch your muscles. Do not do anything that takes extra energy for a few days.  Follow your caregiver's instructions to manage and treat  other pain. Document Released: 07/06/2011 Document Revised: 11/11/2012 Document Reviewed: 07/06/2011 [facility] Patient Information 2015 [place], [place]. This information is not intended to replace advice given to you by your health care provider. Make sure you discuss any questions you have with your health care provider.

## 2014-03-26 NOTE — ED Provider Notes (Signed)
CSN: [id number]     Arrival date & time 03/26/14  1750 History  This chart was scribed for non-physician practitioner, Margarita Mail PA-C, working with Virgel Manifold, MD by Lowella Petties, ED Scribe. The patient was seen in room WTR8/WTR8. Patient's care was started at 6:21 PM.   Chief Complaint  Patient presents with  . Back Pain   The history is provided by the patient. No language interpreter was used.   HPI Comments: Marvin Lam is a 43 y.o. male who presents to the Emergency Department complaining of back discomfort that began along with a cold and bad cough 2 months ago. He reports that the cold symptoms resolved, but the pressure stayed. He reports that the discomfort is exacerbated by breathing and cough. He denies any numbness or tingling in his hands. He denies bloody cough.  He denies any other medical problems. He does not smoke.   Past Medical History  Diagnosis Date  . Glaucoma   . UTI (lower urinary tract infection)    Past Surgical History  Procedure Laterality Date  . Eye surgery    . Lasik     History reviewed. No pertinent family history. History  Substance Use Topics  . Smoking status: Former Research scientist (life sciences)  . Smokeless tobacco: Not on file  . Alcohol Use: No    Review of Systems  Constitutional: Negative for fever and chills.  Musculoskeletal: Positive for back pain.  Neurological: Negative for weakness and numbness.   Allergies  Review of patient's allergies indicates no known allergies.  Home Medications   Prior to Admission medications   Medication Sig Start Date End Date Taking? Authorizing Provider  amoxicillin-clavulanate (AUGMENTIN) 875-125 MG per tablet Take 1 tablet by mouth 2 (two) times daily. 12/10/13   Nat Christen, MD  bimatoprost (LUMIGAN) 0.03 % ophthalmic solution Place 1 drop into both eyes at bedtime.    Historical Provider, MD  cetirizine (ZYRTEC ALLERGY) 10 MG tablet Take 1 tablet (10 mg total) by mouth daily. 12/24/13   Montine Circle,  PA-C  guaiFENesin (MUCINEX) 600 MG 12 hr tablet Take 600 mg by mouth 2 (two) times daily as needed (congestion).    Historical Provider, MD  promethazine-codeine (PHENERGAN WITH CODEINE) 6.25-10 MG/5ML syrup Take 10 mLs by mouth every 6 (six) hours as needed for cough. 12/10/13   Nat Christen, MD  sodium chloride (OCEAN) 0.65 % SOLN nasal spray Place 1 spray into both nostrils as needed for congestion. 12/24/13   Montine Circle, PA-C   Triage Vitals: BP 137/86  Pulse 92  Temp(Src) 98.4 F (36.9 C) (Oral)  Resp 18  SpO2 100% Physical Exam  Nursing note and vitals reviewed. Constitutional: He is oriented to person, place, and time. He appears well-developed and well-nourished. No distress.  HENT:  Head: Normocephalic and atraumatic.  Eyes: Conjunctivae and EOM are normal.  Neck: Neck supple. No tracheal deviation present.  Cardiovascular: Normal rate.   Pulmonary/Chest: Effort normal. No respiratory distress.  Musculoskeletal: Normal range of motion.       Back:  Neurological: He is alert and oriented to person, place, and time.  Skin: Skin is warm and dry.  Psychiatric: He has a normal mood and affect. His behavior is normal.    ED Course  Procedures (including critical care time)  6:26 PM-Discussed treatment plan which includes an inhaler and back exercises with pt at bedside and pt agreed to plan.   Labs Review Labs Reviewed - No data to display  Imaging Review  No results found.   EKG Interpretation None      MDM   Final diagnoses:  Trapezius muscle spasm   Patient felt significant relief with manual therapy to trigger points. Able to alleviate symptoms of his chief compliant patient will be discharged w/ instructions on home therapy. I have no concern for pulmonary etiology and patient is both Wells low risk and PERC negative. Regarding patients cough I believe hie has residual reactive airway from his recent URI.   I personally performed the services described in  this documentation, which was scribed in my presence. The recorded information has been reviewed and is accurate.      Margarita Mail, PA-C 03/26/14 1900

## 2014-03-27 NOTE — ED Provider Notes (Signed)
Medical screening examination/treatment/procedure(s) were performed by non-physician practitioner and as supervising physician I was immediately available for consultation/collaboration.   EKG Interpretation None       Virgel Manifold, MD 03/27/14 [phone]

## 2014-07-30 ENCOUNTER — Emergency Department (HOSPITAL_COMMUNITY)
Admission: EM | Admit: 2014-07-30 | Discharge: 2014-07-30 | Disposition: A | Discharge status: Home / Self Care | Payer: No Typology Code available for payment source | Attending: Emergency Medicine | Admitting: Emergency Medicine

## 2014-07-30 ENCOUNTER — Encounter (HOSPITAL_COMMUNITY): Payer: Self-pay | Admitting: Emergency Medicine

## 2014-07-30 DIAGNOSIS — H409 Unspecified glaucoma: Secondary | ICD-10-CM | POA: Insufficient documentation

## 2014-07-30 DIAGNOSIS — Z87891 Personal history of nicotine dependence: Secondary | ICD-10-CM | POA: Diagnosis not present

## 2014-07-30 DIAGNOSIS — Y92410 Unspecified street and highway as the place of occurrence of the external cause: Secondary | ICD-10-CM | POA: Insufficient documentation

## 2014-07-30 DIAGNOSIS — Y998 Other external cause status: Secondary | ICD-10-CM | POA: Diagnosis not present

## 2014-07-30 DIAGNOSIS — Y9389 Activity, other specified: Secondary | ICD-10-CM | POA: Diagnosis not present

## 2014-07-30 DIAGNOSIS — S29092A Other injury of muscle and tendon of back wall of thorax, initial encounter: Secondary | ICD-10-CM | POA: Diagnosis not present

## 2014-07-30 DIAGNOSIS — Z87440 Personal history of urinary (tract) infections: Secondary | ICD-10-CM | POA: Diagnosis not present

## 2014-07-30 MED ORDER — METHOCARBAMOL 500 MG PO TABS: 500.0000 mg | ORAL_TABLET | Freq: Two times a day (BID) | ORAL | Status: DC

## 2014-07-30 MED ORDER — IBUPROFEN 800 MG PO TABS: 800.0000 mg | ORAL_TABLET | Freq: Three times a day (TID) | ORAL | Status: DC
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
## 2014-07-30 NOTE — Discharge Instructions (Signed)

## 2015-04-08 ENCOUNTER — Emergency Department (HOSPITAL_COMMUNITY)
Admission: EM | Admit: 2015-04-08 | Discharge: 2015-04-08 | Disposition: A | Discharge status: Home / Self Care | Payer: Self-pay | Attending: Emergency Medicine | Admitting: Emergency Medicine

## 2015-04-08 ENCOUNTER — Encounter (HOSPITAL_COMMUNITY): Payer: Self-pay

## 2015-04-08 DIAGNOSIS — Z87891 Personal history of nicotine dependence: Secondary | ICD-10-CM | POA: Insufficient documentation

## 2015-04-08 DIAGNOSIS — R110 Nausea: Secondary | ICD-10-CM | POA: Insufficient documentation

## 2015-04-08 DIAGNOSIS — H409 Unspecified glaucoma: Secondary | ICD-10-CM | POA: Insufficient documentation

## 2015-04-08 DIAGNOSIS — Z79899 Other long term (current) drug therapy: Secondary | ICD-10-CM | POA: Insufficient documentation

## 2015-04-08 DIAGNOSIS — Z87440 Personal history of urinary (tract) infections: Secondary | ICD-10-CM | POA: Insufficient documentation

## 2015-04-08 DIAGNOSIS — R0982 Postnasal drip: Secondary | ICD-10-CM | POA: Insufficient documentation

## 2015-04-08 DIAGNOSIS — R1310 Dysphagia, unspecified: Secondary | ICD-10-CM | POA: Insufficient documentation

## 2015-04-08 DIAGNOSIS — R1319 Other dysphagia: Secondary | ICD-10-CM

## 2015-04-08 DIAGNOSIS — Z791 Long term (current) use of non-steroidal anti-inflammatories (NSAID): Secondary | ICD-10-CM | POA: Insufficient documentation

## 2015-04-08 MED ORDER — GI COCKTAIL ~~LOC~~
30.0000 mL | Freq: Once | ORAL | Status: AC
  Administered 2015-04-08: 30 mL via ORAL
  Filled 2015-04-08: qty 30

## 2015-04-08 NOTE — ED Notes (Signed)
POV c/o difficulty swallowing x3 weeks, with sinus drainage. Denies sore throat. No stridor, handling secretions without difficulty.

## 2015-04-08 NOTE — ED Provider Notes (Signed)
CSN: [id number]     Arrival date & time 04/08/15  1821 History  This chart was scribed for non-physician practitioner, Etta Quill, NP working with Francine Graven, DO, by Helane Gunther ED Scribe. This patient was seen in room WTR8/WTR8 and the patient's care was started at 7:13 PM    Chief Complaint  Patient presents with  . Dysphagia   The history is provided by the patient. No language interpreter was used.   HPI Comments: Marvin Lam is a 44 y.o. male who presents to the Emergency Department complaining of dysphagia onset 3 weeks ago. Pt states that his throat feels uncomfortable with swallowing, but not painful. He reports associated post-nasal drip and upset stomach. 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 He just started his job 2.5 months ago and will be eligible for health insurance through work in a little while. Pt denies sore throat.   Past Medical History  Diagnosis Date  . Glaucoma   . UTI (lower urinary tract infection)    Past Surgical History  Procedure Laterality Date  . Eye surgery    . Lasik     No family history on file. Social History  Substance Use Topics  . Smoking status: Former Research scientist (life sciences)  . Smokeless tobacco: None  . Alcohol Use: No    Review of Systems  HENT: Positive for postnasal drip and trouble swallowing. Negative for sore throat.   Gastrointestinal: Positive for nausea.  All other systems reviewed and are negative.   Allergies  Review of patient's allergies indicates no known allergies.  Home Medications   Prior to Admission medications   Medication Sig Start Date End Date Taking? Authorizing Provider  bimatoprost (LUMIGAN) 0.03 % ophthalmic solution Place 1 drop into both eyes at bedtime.    Historical Provider, MD  ibuprofen (ADVIL,MOTRIN) 800 MG tablet Take 1 tablet (800 mg total) by mouth 3 (three) times daily. 07/30/14   Domenic Moras, PA-C  methocarbamol (ROBAXIN) 500 MG tablet Take 1 tablet (500 mg  total) by mouth 2 (two) times daily. 07/30/14   Domenic Moras, PA-C   BP 130/86 mmHg  Pulse 83  Temp(Src) 98.2 F (36.8 C) (Oral)  Resp 18  Wt 215 lb (97.523 kg)  SpO2 98% Physical Exam  Constitutional: He appears well-developed and well-nourished.  HENT:  Head: Normocephalic and atraumatic.  Mouth/Throat: No oropharyngeal exudate.  Mild erythema to the throat, no exudate, no difficulty swallowing on exam, no SOB.  Eyes: Conjunctivae are normal. Right eye exhibits no discharge. Left eye exhibits no discharge.  Cardiovascular: Normal rate, regular rhythm and normal heart sounds.   Pulmonary/Chest: Effort normal and breath sounds normal. No respiratory distress. He has no wheezes.  Lymphadenopathy:    He has no cervical adenopathy.  Neurological: He is alert. Coordination normal.  Skin: Skin is warm and dry. No rash noted. He is not diaphoretic. No erythema.  Psychiatric: He has a normal mood and affect.  Nursing note and vitals reviewed.   ED Course  Procedures  DIAGNOSTIC STUDIES: Oxygen Saturation is 98% on RA, normal by my interpretation.    COORDINATION OF CARE: 7:19 PM - Discussed plans to order GI cocktail and NSAIDs. Advised to stay well-hydrated and return to the ED if sx worsen and/or pt is unable to breathe. Pt advised of plan for treatment and pt agrees.  Labs Review Labs Reviewed - No data to display  Imaging Review No results found. I have personally reviewed and  evaluated these images and lab results as part of my medical decision-making.   EKG Interpretation None      MDM   Final diagnoses:  None    Dysphagia. No shortness of breath. Is able to swallow liquids and solids with minimal discomfort. No neurologic findings.   I personally performed the services described in this documentation, which was scribed in my presence. The recorded information has been reviewed and is accurate.   Etta Quill, NP 04/08/15 2031  Francine Graven,  DO 04/11/15 [phone]

## 2015-04-08 NOTE — Discharge Instructions (Signed)

## 2015-08-19 ENCOUNTER — Encounter (HOSPITAL_COMMUNITY): Payer: Self-pay | Admitting: Emergency Medicine

## 2015-08-19 ENCOUNTER — Emergency Department (HOSPITAL_COMMUNITY)
Admission: EM | Admit: 2015-08-19 | Discharge: 2015-08-19 | Disposition: A | Discharge status: Home / Self Care | Payer: BLUE CROSS/BLUE SHIELD | Attending: Emergency Medicine | Admitting: Emergency Medicine

## 2015-08-19 DIAGNOSIS — R0981 Nasal congestion: Secondary | ICD-10-CM

## 2015-08-19 DIAGNOSIS — R05 Cough: Secondary | ICD-10-CM | POA: Diagnosis present

## 2015-08-19 DIAGNOSIS — R0982 Postnasal drip: Secondary | ICD-10-CM | POA: Insufficient documentation

## 2015-08-19 DIAGNOSIS — Z791 Long term (current) use of non-steroidal anti-inflammatories (NSAID): Secondary | ICD-10-CM | POA: Diagnosis not present

## 2015-08-19 DIAGNOSIS — H409 Unspecified glaucoma: Secondary | ICD-10-CM | POA: Insufficient documentation

## 2015-08-19 DIAGNOSIS — Z79899 Other long term (current) drug therapy: Secondary | ICD-10-CM | POA: Diagnosis not present

## 2015-08-19 DIAGNOSIS — Z87891 Personal history of nicotine dependence: Secondary | ICD-10-CM | POA: Insufficient documentation

## 2015-08-19 DIAGNOSIS — Z87440 Personal history of urinary (tract) infections: Secondary | ICD-10-CM | POA: Insufficient documentation

## 2015-08-19 MED ORDER — AZITHROMYCIN 250 MG PO TABS: 250.0000 mg | ORAL_TABLET | Freq: Every day | ORAL | Status: DC

## 2015-08-19 NOTE — Discharge Instructions (Signed)

## 2015-08-19 NOTE — ED Provider Notes (Signed)
CSN: [id number]     Arrival date & time 08/19/15  1648 History  By signing my name below, I, Marvin Lam, attest that this documentation has been prepared under the direction and in the presence of Alyse Low, [place].  Electronically Signed: Eustaquio Lam, ED Scribe. 08/19/2015. 5:34 PM.   Chief Complaint  Patient presents with  . Cough   The history is provided by the patient. No language interpreter was used.     HPI Comments: Marvin Lam is a 45 y.o. male who presents to the Emergency Department complaining of gradual onset, constant, dry cough x 1.5-2 weeks. He states that he has a tickling sensation in his throat with mild post nasal drainage which causes him to cough. Denies fever, chills, or any other associated symptoms. Pt is current occasional smoker who smokes about 2 cigarettes per week.   Past Medical History  Diagnosis Date  . Glaucoma   . UTI (lower urinary tract infection)    Past Surgical History  Procedure Laterality Date  . Eye surgery    . Lasik     History reviewed. No pertinent family history. Social History  Substance Use Topics  . Smoking status: Former Research scientist (life sciences)  . Smokeless tobacco: None  . Alcohol Use: No    Review of Systems  Constitutional: Negative for fever and chills.  HENT: Positive for postnasal drip.   Respiratory: Positive for cough.   All other systems reviewed and are negative.   Allergies  Review of patient's allergies indicates no known allergies.  Home Medications   Prior to Admission medications   Medication Sig Start Date End Date Taking? Authorizing Provider  bimatoprost (LUMIGAN) 0.03 % ophthalmic solution Place 1 drop into both eyes at bedtime.    Historical Provider, MD  ibuprofen (ADVIL,MOTRIN) 800 MG tablet Take 1 tablet (800 mg total) by mouth 3 (three) times daily. 07/30/14   Domenic Moras, PA-C  methocarbamol (ROBAXIN) 500 MG tablet Take 1 tablet (500 mg total) by mouth 2 (two) times daily. 07/30/14   Domenic Moras, PA-C    Triage Vitals:  BP 138/92 mmHg  Pulse 88  Temp(Src) 97.8 F (36.6 C) (Oral)  Resp 18  SpO2 98%   Physical Exam  Constitutional: He is oriented to person, place, and time. He appears well-developed and well-nourished. No distress.  HENT:  Head: Normocephalic and atraumatic.  Post nasal drainage Slightly erythematous throat  Eyes: Conjunctivae and EOM are normal.  Neck: Neck supple. No tracheal deviation present.  Cardiovascular: Normal rate.   Pulmonary/Chest: Effort normal and breath sounds normal. No respiratory distress. He has no wheezes. He has no rales.  Musculoskeletal: Normal range of motion.  Neurological: He is alert and oriented to person, place, and time.  Skin: Skin is warm and dry.  Psychiatric: He has a normal mood and affect. His behavior is normal.  Nursing note and vitals reviewed.   ED Course  Procedures (including critical care time)  DIAGNOSTIC STUDIES: Oxygen Saturation is 98% on RA, normal by my interpretation.    COORDINATION OF CARE: 5:33 PM-Discussed treatment plan which includes Rx antibiotics with pt at bedside and pt agreed to plan.   Labs Review Labs Reviewed - No data to display  Imaging Review No results found.    EKG Interpretation None      MDM   Final diagnoses:  Sinus congestion    I personally performed the services in this documentation, which was scribed in my presence.  The recorded information has been  reviewed and considered.   Ronnald Collum.     Hollace Kinnier [place], PA-C 08/19/15 1746  Noemi Chapel, MD 08/21/15 [phone]

## 2015-08-19 NOTE — ED Provider Notes (Cosign Needed)
CSN: [id number]     Arrival date & time 08/19/15  1648 History   First MD Initiated Contact with Patient 08/19/15 1730     Chief Complaint  Patient presents with  . Cough     (Consider location/radiation/quality/duration/timing/severity/associated sxs/prior Treatment) HPI  Past Medical History  Diagnosis Date  . Glaucoma   . UTI (lower urinary tract infection)    Past Surgical History  Procedure Laterality Date  . Eye surgery    . Lasik     History reviewed. No pertinent family history. Social History  Substance Use Topics  . Smoking status: Former Research scientist (life sciences)  . Smokeless tobacco: None  . Alcohol Use: No    Review of Systems    Allergies  Review of patient's allergies indicates no known allergies.  Home Medications   Prior to Admission medications   Medication Sig Start Date End Date Taking? Authorizing Provider  azithromycin (ZITHROMAX) 250 MG tablet Take 1 tablet (250 mg total) by mouth daily. Take first 2 tablets together, then 1 every day until finished. 08/19/15   Fransico Meadow, PA-C  bimatoprost (LUMIGAN) 0.03 % ophthalmic solution Place 1 drop into both eyes at bedtime.    Historical Provider, MD  ibuprofen (ADVIL,MOTRIN) 800 MG tablet Take 1 tablet (800 mg total) by mouth 3 (three) times daily. 07/30/14   Domenic Moras, PA-C  methocarbamol (ROBAXIN) 500 MG tablet Take 1 tablet (500 mg total) by mouth 2 (two) times daily. 07/30/14   Domenic Moras, PA-C   BP 138/92 mmHg  Pulse 88  Temp(Src) 97.8 F (36.6 C) (Oral)  Resp 18  SpO2 98% Physical Exam  ED Course  Procedures (including critical care time) Labs Review Labs Reviewed - No data to display  Imaging Review No results found. I have personally reviewed and evaluated these images and lab results as part of my medical decision-making.   EKG Interpretation None      MDM   Final diagnoses:  Sinus congestion    Meds ordered this encounter  Medications  . azithromycin (ZITHROMAX) 250 MG tablet   Sig: Take 1 tablet (250 mg total) by mouth daily. Take first 2 tablets together, then 1 every day until finished.    Dispense:  6 tablet    Refill:  0    Order Specific Question:  Supervising Provider    Answer:  Alfonzo Beers [id number]      [place], PA-C 08/19/15 1746

## 2015-08-19 NOTE — ED Notes (Signed)
Pt states he got over a cold a week ago, since then has had a "tickle" in the back of his throat that's causing him to cough. Dry cough observed in triage, lung sounds clear, no pain/SHOB, N/V/D.

## 2015-09-06 ENCOUNTER — Encounter (HOSPITAL_COMMUNITY): Payer: Self-pay | Admitting: Emergency Medicine

## 2015-09-06 ENCOUNTER — Emergency Department (HOSPITAL_COMMUNITY)
Admission: EM | Admit: 2015-09-06 | Discharge: 2015-09-06 | Disposition: A | Discharge status: Home / Self Care | Payer: BLUE CROSS/BLUE SHIELD | Attending: Emergency Medicine | Admitting: Emergency Medicine

## 2015-09-06 ENCOUNTER — Emergency Department (HOSPITAL_COMMUNITY): Payer: BLUE CROSS/BLUE SHIELD

## 2015-09-06 DIAGNOSIS — Z87891 Personal history of nicotine dependence: Secondary | ICD-10-CM | POA: Diagnosis not present

## 2015-09-06 DIAGNOSIS — Z87440 Personal history of urinary (tract) infections: Secondary | ICD-10-CM | POA: Insufficient documentation

## 2015-09-06 DIAGNOSIS — Z8669 Personal history of other diseases of the nervous system and sense organs: Secondary | ICD-10-CM | POA: Insufficient documentation

## 2015-09-06 DIAGNOSIS — J40 Bronchitis, not specified as acute or chronic: Secondary | ICD-10-CM | POA: Diagnosis not present

## 2015-09-06 DIAGNOSIS — Z79899 Other long term (current) drug therapy: Secondary | ICD-10-CM | POA: Insufficient documentation

## 2015-09-06 DIAGNOSIS — R05 Cough: Secondary | ICD-10-CM | POA: Diagnosis present

## 2015-09-06 MED ORDER — GUAIFENESIN-CODEINE 100-10 MG/5ML PO SOLN: 10.0000 mL | Freq: Four times a day (QID) | ORAL | Status: DC | PRN

## 2015-09-06 MED ORDER — GUAIFENESIN-CODEINE 100-10 MG/5ML PO SOLN
10.0000 mL | ORAL | Status: DC
  Filled 2015-09-06: qty 10

## 2015-09-06 NOTE — ED Provider Notes (Signed)
CSN: [id number]     Arrival date & time 09/06/15  1833 History  By signing my name below, I, Irene Pap, attest that this documentation has been prepared under the direction and in the presence of Junius Creamer, NP-C. Electronically Signed: Irene Pap, ED Scribe. 09/06/2015. 8:15 PM.   Chief Complaint  Patient presents with  . Cough   The history is provided by the patient. No language interpreter was used.  HPI Comments: Marvin Lam is a 45 y.o. male who presents to the Emergency Department complaining of a gradually worsening, persistent dry cough onset 3 weeks ago. He states that it feels like it is in his airways and worsens when at rest. He reports associated congestion. He was seen 2 weeks ago in the ED and given antibiotics to no relief. Pt has not tried anything outside of the anti-biotics for his symptoms. He denies hx of asthma, fever, chills, nausea, vomiting, and diarrhea.  Past Medical History  Diagnosis Date  . Glaucoma   . UTI (lower urinary tract infection)    Past Surgical History  Procedure Laterality Date  . Eye surgery    . Lasik     History reviewed. No pertinent family history. Social History  Substance Use Topics  . Smoking status: Former Research scientist (life sciences)  . Smokeless tobacco: None  . Alcohol Use: No    Review of Systems  Constitutional: Negative for fever and chills.  HENT: Positive for postnasal drip. Negative for congestion.   Respiratory: Positive for cough. Negative for shortness of breath and wheezing.   Gastrointestinal: Negative for nausea, vomiting and diarrhea.  All other systems reviewed and are negative.  Allergies  Review of patient's allergies indicates no known allergies.  Home Medications   Prior to Admission medications   Medication Sig Start Date End Date Taking? Authorizing Provider  bimatoprost (LUMIGAN) 0.03 % ophthalmic solution Place 1 drop into both eyes at bedtime.   Yes Historical Provider, MD  Omega-3 Fatty Acids (FISH OIL  PO) Take 1 capsule by mouth daily.   Yes Historical Provider, MD  azithromycin (ZITHROMAX) 250 MG tablet Take 1 tablet (250 mg total) by mouth daily. Take first 2 tablets together, then 1 every day until finished. Patient not taking: Reported on 09/06/2015 08/19/15   Fransico Meadow, PA-C   BP 131/85 mmHg  Pulse 86  Temp(Src) 98.4 F (36.9 C) (Oral)  SpO2 99% Physical Exam  Constitutional: He is oriented to person, place, and time. He appears well-developed and well-nourished.  HENT:  Head: Normocephalic.  Mouth/Throat: Oropharynx is clear and moist.  Eyes: Pupils are equal, round, and reactive to light.  Neck: Normal range of motion.  Cardiovascular: Normal rate and regular rhythm.   Pulmonary/Chest: Effort normal and breath sounds normal. No respiratory distress.  Musculoskeletal: Normal range of motion.  Neurological: He is alert and oriented to person, place, and time.  Skin: Skin is warm.  Nursing note and vitals reviewed.   ED Course  Procedures (including critical care time) DIAGNOSTIC STUDIES: Oxygen Saturation is 99% on RA, normal by my interpretation.    COORDINATION OF CARE: 8:05 PM-Discussed treatment plan which includes cough medication with pt at bedside and pt agreed to plan.    Labs Review Labs Reviewed - No data to display  Imaging Review Dg Chest 2 View  09/06/2015  CLINICAL DATA:  Dry cough starting 1 month ago. EXAM: CHEST  2 VIEW COMPARISON:  None. FINDINGS: Midline trachea. Normal heart size and mediastinal contours. Sharp costophrenic  angles. No pneumothorax. Clear lungs. IMPRESSION: No active cardiopulmonary disease. Electronically Signed   By: Abigail Miyamoto M.D.   On: 09/06/2015 19:39   I have personally reviewed and evaluated these images and lab results as part of my medical decision-making.   EKG Interpretation None      MDM   Final diagnoses:  None    I personally performed the services described in this documentation, which was scribed in  my presence. The recorded information has been reviewed and is accurate.   Junius Creamer, NP 09/06/15 2019  Charlesetta Shanks, MD 09/13/15 [phone]

## 2015-09-06 NOTE — ED Notes (Signed)
Pt states he had a cough that started around 3 weeks ago. Says it's a "dry cough that feels like a tickle in the back of my throat." States he was seen here 2 weeks prior and given antibiotics. Says he finished the course of antibiotics with no relief. Dry cough observed in triage, lung sounds clear, denies N/V/D, fever/chills.

## 2015-09-06 NOTE — Discharge Instructions (Signed)
You have been give a prescription for cough control Make an appointment with your PCP for follow up care

## 2016-01-04 ENCOUNTER — Encounter (HOSPITAL_COMMUNITY): Payer: Self-pay | Admitting: Emergency Medicine

## 2016-01-04 ENCOUNTER — Emergency Department (HOSPITAL_COMMUNITY)
Admission: EM | Admit: 2016-01-04 | Discharge: 2016-01-04 | Disposition: A | Discharge status: Home / Self Care | Payer: BLUE CROSS/BLUE SHIELD | Attending: Emergency Medicine | Admitting: Emergency Medicine

## 2016-01-04 DIAGNOSIS — H9209 Otalgia, unspecified ear: Secondary | ICD-10-CM | POA: Diagnosis not present

## 2016-01-04 DIAGNOSIS — Z87891 Personal history of nicotine dependence: Secondary | ICD-10-CM | POA: Insufficient documentation

## 2016-01-04 DIAGNOSIS — B349 Viral infection, unspecified: Secondary | ICD-10-CM | POA: Diagnosis not present

## 2016-01-04 DIAGNOSIS — R0981 Nasal congestion: Secondary | ICD-10-CM | POA: Diagnosis present

## 2016-01-04 MED ORDER — FLUTICASONE PROPIONATE 50 MCG/ACT NA SUSP: 1.0000 | Freq: Every day | NASAL | Status: DC

## 2016-01-04 NOTE — ED Notes (Signed)
Patient here with complaints of nasal drainage and ear pain x2 weeks.

## 2016-01-04 NOTE — ED Provider Notes (Signed)
CSN: [id number]     Arrival date & time 01/04/16  1810 History  By signing my name below, I, [facility], attest that this documentation has been prepared under the direction and in the presence of Shary Decamp, PA-C. Electronically Signed: Virgel Bouquet, ED Scribe. 01/04/2016. 7:55 PM.   Chief Complaint  Patient presents with  . Nasal Congestion  . Otalgia    The history is provided by the patient. No language interpreter was used.   HPI Comments: Marvin Lam is a 45 y.o. male who presents to the Emergency Department complaining of intermittent, mild post-nasal drip ongoing for the past 2 weeks. Pt states that he feels like something is draining from his nose and is dripping down his throat. He notes that he has been swallowing this mucus followed by sore throat and "digestive problems" over the past week. He reports intermittent nasal congestion. He notes similar symptoms in the past that improved with oral steroids but notes that he did not finish this medication. Denies fevers, cough, ear pain, CP, and SOB.  Past Medical History  Diagnosis Date  . Glaucoma   . UTI (lower urinary tract infection)    Past Surgical History  Procedure Laterality Date  . Eye surgery    . Lasik     History reviewed. No pertinent family history. Social History  Substance Use Topics  . Smoking status: Former Research scientist (life sciences)  . Smokeless tobacco: None  . Alcohol Use: No    Review of Systems  Constitutional: Negative for fever.  HENT: Positive for congestion, postnasal drip and sore throat. Negative for ear pain.   Respiratory: Negative for cough and shortness of breath.   Cardiovascular: Negative for chest pain.   Allergies  Review of patient's allergies indicates no known allergies.  Home Medications   Prior to Admission medications   Medication Sig Start Date End Date Taking? Authorizing Provider  azithromycin (ZITHROMAX) 250 MG tablet Take 1 tablet (250 mg total) by mouth daily. Take  first 2 tablets together, then 1 every day until finished. Patient not taking: Reported on 09/06/2015 08/19/15   Fransico Meadow, PA-C  bimatoprost (LUMIGAN) 0.03 % ophthalmic solution Place 1 drop into both eyes at bedtime.    Historical Provider, MD  guaiFENesin-codeine 100-10 MG/5ML syrup Take 10 mLs by mouth every 6 (six) hours as needed for cough. 09/06/15   Junius Creamer, NP  Omega-3 Fatty Acids (FISH OIL PO) Take 1 capsule by mouth daily.    Historical Provider, MD   BP 118/73 mmHg  Pulse 87  Temp(Src) 98.5 F (36.9 C) (Oral)  Resp 17  SpO2 100%   Physical Exam  Constitutional: He is oriented to person, place, and time. He appears well-developed and well-nourished. No distress.  HENT:  Head: Normocephalic and atraumatic.  Right Ear: Tympanic membrane, external ear and ear canal normal.  Left Ear: Tympanic membrane, external ear and ear canal normal.  Nose: Nose normal.  Mouth/Throat: Uvula is midline, oropharynx is clear and moist and mucous membranes are normal. No trismus in the jaw. No oropharyngeal exudate, posterior oropharyngeal erythema or tonsillar abscesses.  Eyes: Conjunctivae and EOM are normal. Pupils are equal, round, and reactive to light.  Neck: Normal range of motion. Neck supple. No tracheal deviation present.  Cardiovascular: Normal rate, regular rhythm, S1 normal, S2 normal, normal heart sounds, intact distal pulses and normal pulses.   Pulmonary/Chest: Effort normal and breath sounds normal. No respiratory distress. He has no decreased breath sounds. He has no  wheezes. He has no rhonchi. He has no rales.  Abdominal: Normal appearance and bowel sounds are normal. There is no tenderness.  Musculoskeletal: Normal range of motion.  Neurological: He is alert and oriented to person, place, and time.  Skin: Skin is warm and dry.  Psychiatric: He has a normal mood and affect. His speech is normal and behavior is normal. Thought content normal.  Nursing note and vitals  reviewed.   ED Course  Procedures (including critical care time)  DIAGNOSTIC STUDIES: Oxygen Saturation is 100% on RA, normal by my interpretation.    COORDINATION OF CARE: 7:44 PM Will prescribe Flonase. Discussed treatment plan with pt at bedside and pt agreed to plan.   MDM  I have reviewed the relevant previous healthcare records. I obtained HPI from historian.  ED Course:  Assessment: Patient complaining of symptoms of rhinorrhea.  Mild to moderate symptoms of clear/yellow nasal discharge/congestion and scratchy throat without cough for greater than 10 days.  Patient is afebrile.  No concern for acute bacterial rhinosinusitis; likely viral in nature.  Patient discharged with symptomatic treatment.  Patient instructions given for warm saline nasal washes.  Given Rx Flonase. Recommendations for follow-up with primary care physician.    Disposition/Plan:  DC Home Additional Verbal discharge instructions given and discussed with patient.  Pt Instructed to f/u with PCP in the next week for evaluation and treatment of symptoms. Return precautions given Pt acknowledges and agrees with plan  Supervising Physician Lacretia Leigh, MD   Final diagnoses:  Viral syndrome   I personally performed the services described in this documentation, which was scribed in my presence. The recorded information has been reviewed and is accurate.   Shary Decamp, PA-C 01/04/16 1956  Lacretia Leigh, MD 01/05/16 1340

## 2016-01-04 NOTE — Discharge Instructions (Signed)
Please read and follow all provided instructions.  Your diagnoses today include:  1. Viral syndrome    Tests performed today include:  Vital signs. See below for your results today.   Medications prescribed:   Take as prescribed   Home care instructions:  Follow any educational materials contained in this packet.  Follow-up instructions: Please follow-up with your primary care provider for further evaluation of symptoms and treatment   Return instructions:   Please return to the Emergency Department if you do not get better, if you get worse, or new symptoms OR  - Fever (temperature greater than 101.60F)  - Bleeding that does not stop with holding pressure to the area    -Severe pain (please note that you may be more sore the day after your accident)  - Chest Pain  - Difficulty breathing  - Severe nausea or vomiting  - Inability to tolerate food and liquids  - Passing out  - Skin becoming red around your wounds  - Change in mental status (confusion or lethargy)  - New numbness or weakness     Please return if you have any other emergent concerns.  Additional Information:  Your vital signs today were: BP 118/73 mmHg   Pulse 87   Temp(Src) 98.5 F (36.9 C) (Oral)   Resp 17   SpO2 100% If your blood pressure (BP) was elevated above 135/85 this visit, please have this repeated by your doctor within one month. ---------------

## 2016-02-28 ENCOUNTER — Encounter (HOSPITAL_COMMUNITY): Payer: Self-pay | Admitting: Emergency Medicine

## 2016-02-28 ENCOUNTER — Emergency Department (HOSPITAL_COMMUNITY)
Admission: EM | Admit: 2016-02-28 | Discharge: 2016-02-28 | Disposition: A | Discharge status: Home / Self Care | Payer: BLUE CROSS/BLUE SHIELD | Attending: Emergency Medicine | Admitting: Emergency Medicine

## 2016-02-28 DIAGNOSIS — F458 Other somatoform disorders: Secondary | ICD-10-CM | POA: Diagnosis not present

## 2016-02-28 DIAGNOSIS — Z87891 Personal history of nicotine dependence: Secondary | ICD-10-CM | POA: Diagnosis not present

## 2016-02-28 DIAGNOSIS — R0982 Postnasal drip: Secondary | ICD-10-CM

## 2016-02-28 DIAGNOSIS — R0989 Other specified symptoms and signs involving the circulatory and respiratory systems: Secondary | ICD-10-CM

## 2016-02-28 MED ORDER — FLUTICASONE PROPIONATE 50 MCG/ACT NA SUSP: 2.0000 | Freq: Every day | NASAL | 0 refills | Status: DC

## 2016-02-28 MED ORDER — LEVOCETIRIZINE DIHYDROCHLORIDE 5 MG PO TABS: 5.0000 mg | ORAL_TABLET | Freq: Every evening | ORAL | 0 refills | Status: DC

## 2016-02-28 NOTE — ED Provider Notes (Signed)
[place] DEPT Provider Note By signing my name below, I, Mesha Guinyard, attest that this documentation has been prepared under the direction and in the presence of Treatment Team:  Attending Provider: Milton Ferguson, MD Physician Assistant: Zacarias Pontes, PA-C.  Electronically Signed: Verlee Monte, Medical Scribe. 02/28/16. 5:51 PM.  CSN: [id number] Arrival date & time: 02/28/16  1652  First Provider Contact:  None   History   Chief Complaint Chief Complaint  Patient presents with  . Nasal Congestion   HPI Comments: Marvin Lam is a 45 y.o. male who presents to the Emergency Department complaining of sinus congestion and postnasal drip x 3 months. He was seen in June for similar symptoms, prescribed flonase but states it's not helping. He has not tried anything else for his symptoms. States that some foods seems to aggravate the symptoms occasionally, but not all foods. He states that he is having associated symptoms of globus sensation and ear fullness. He denies drooling, trismus, sore throat, ear pain/discharge, rhinorrhea, fevers, chills, CP, SOB, cough, abd pain, N/V/D/C, hematuria, dysuria, myalgias, arthralgias, numbness, tingling, weakness, or rashes.  HPI  Past Medical History:  Diagnosis Date  . Glaucoma   . UTI (lower urinary tract infection)     Patient Active Problem List   Diagnosis Date Noted  . DYSLIPIDEMIA 03/09/2007  . GLAUCOMA NOS 03/09/2007  . DYSURIA 03/09/2007    Past Surgical History:  Procedure Laterality Date  . EYE SURGERY    . LASIK       Home Medications    Prior to Admission medications   Medication Sig Start Date End Date Taking? Authorizing Provider  azithromycin (ZITHROMAX) 250 MG tablet Take 1 tablet (250 mg total) by mouth daily. Take first 2 tablets together, then 1 every day until finished. Patient not taking: Reported on 09/06/2015 08/19/15   Fransico Meadow, PA-C  bimatoprost (LUMIGAN) 0.03 % ophthalmic solution  Place 1 drop into both eyes at bedtime.    Historical Provider, MD  fluticasone (FLONASE) 50 MCG/ACT nasal spray Place 1 spray into both nostrils daily. 01/04/16   Shary Decamp, PA-C  guaiFENesin-codeine 100-10 MG/5ML syrup Take 10 mLs by mouth every 6 (six) hours as needed for cough. 09/06/15   Junius Creamer, NP  Omega-3 Fatty Acids (FISH OIL PO) Take 1 capsule by mouth daily.    Historical Provider, MD    Family History No family history on file.  Social History Social History  Substance Use Topics  . Smoking status: Former Research scientist (life sciences)  . Smokeless tobacco: Not on file  . Alcohol use No     Allergies   Review of patient's allergies indicates no known allergies.   Review of Systems Review of Systems  Constitutional: Negative for chills and fever.  HENT: Positive for congestion, postnasal drip, rhinorrhea and sinus pressure. Negative for drooling, ear discharge, ear pain, sore throat and trouble swallowing.   Respiratory: Negative for cough, shortness of breath and wheezing.   Cardiovascular: Negative for chest pain.  Gastrointestinal: Negative for abdominal pain, constipation, diarrhea, nausea and vomiting.  Genitourinary: Negative for difficulty urinating, dysuria and hematuria.  Musculoskeletal: Negative for arthralgias, joint swelling, myalgias and neck pain.  Skin: Negative for rash.  Allergic/Immunologic: Negative for immunocompromised state.  Neurological: Negative for weakness and numbness.  Psychiatric/Behavioral: Negative for confusion.  A complete 10 system review of systems was obtained and all systems are negative except as noted in the HPI and PMH.   Physical Exam Updated Vital Signs BP 128/89 (BP Location:  Left Arm)   Pulse 99   Temp 98.7 F (37.1 C) (Oral)   Resp 17   SpO2 98%   Physical Exam  Constitutional: He is oriented to person, place, and time. Vital signs are normal. He appears well-developed and well-nourished.  Non-toxic appearance. No distress.  Afebrile,  nontoxic, NAD  HENT:  Head: Normocephalic and atraumatic.  Nose: Mucosal edema and rhinorrhea present.  Mouth/Throat: Uvula is midline, oropharynx is clear and moist and mucous membranes are normal. No trismus in the jaw. No uvula swelling.  Nose with mild mucosal turbinates edema and rhinorrhea with some mild cobblestoning. Oropharynx clear and moist, without uvular swelling or deviation, no trismus or drooling, no tonsillar swelling or erythema, no exudates.   Eyes: Conjunctivae and EOM are normal. Right eye exhibits no discharge. Left eye exhibits no discharge.  Neck: Normal range of motion. Neck supple.  Cardiovascular: Normal rate.   Pulmonary/Chest: Effort normal. No respiratory distress.  Abdominal: Normal appearance. He exhibits no distension.  Musculoskeletal: Normal range of motion.  Neurological: He is alert and oriented to person, place, and time. He has normal strength. No sensory deficit.  Skin: Skin is warm, dry and intact. No rash noted.  Psychiatric: He has a normal mood and affect.  Nursing note and vitals reviewed.  ED Treatments / Results  Labs (all labs ordered are listed, but only abnormal results are displayed) Labs Reviewed - No data to display  DIAGNOSTIC STUDIES: Oxygen Saturation is 98% on RA, nl by my interpretation.    COORDINATION OF CARE: 5:56 PM Discussed treatment plan with pt at bedside and pt agreed to plan.   EKG  EKG Interpretation None       Radiology No results found.  Procedures Procedures (including critical care time)  Medications Ordered in ED Medications - No data to display   Initial Impression / Assessment and Plan / ED Course  I have reviewed the triage vital signs and the nursing notes.  Pertinent labs & imaging results that were available during my care of the patient were reviewed by me and considered in my medical decision making (see chart for details).  Clinical Course    45 y.o. male here with ongoing  postnasal drainage x3 months, with globus sensation. Has tried flonase without relief. Not taking anything else. Airway intact, throat clear, nasal turbinates edematous and with mild rhinorrhea and cobblestoning. Likely allergic etiology. Doubt infection. Will treat with flonase plus antihistamine. F/up with [facility] in 1wk to establish care and recheck symptoms. I explained the diagnosis and have given explicit precautions to return to the ER including for any other new or worsening symptoms. The patient understands and accepts the medical plan as it's been dictated and I have answered their questions. Discharge instructions concerning home care and prescriptions have been given. The patient is STABLE and is discharged to home in good condition.   Final Clinical Impressions(s) / ED Diagnoses   Final diagnoses:  Postnasal drip  Globus sensation    New Prescriptions New Prescriptions   FLUTICASONE (FLONASE) 50 MCG/ACT NASAL SPRAY    Place 2 sprays into both nostrils daily.   LEVOCETIRIZINE (XYZAL) 5 MG TABLET    Take 1 tablet (5 mg total) by mouth every evening.    I personally performed the services described in this documentation, which was scribed in my presence. The recorded information has been reviewed and is accurate.    [address] [place], PA-C 02/28/16 1818    Milton Ferguson, MD 02/28/16 2306

## 2016-02-28 NOTE — Discharge Instructions (Signed)
Continue to stay well-hydrated. Gargle warm salt water and spit it out. Continue to alternate between Tylenol and Ibuprofen for pain or fever. Use netipot and flonase to help with nasal congestion. Use antihistamine to decrease secretions and to help with your symptoms. Followup with [facility] in 5-7 days for recheck of ongoing symptoms and to establish medical care. Return to emergency department for emergent changing or worsening of symptoms.

## 2016-02-28 NOTE — ED Triage Notes (Signed)
Pt complaint of nasal drainage for 3 months; denies cough or pain.

## 2016-09-10 ENCOUNTER — Encounter (HOSPITAL_COMMUNITY): Payer: Self-pay | Admitting: Emergency Medicine

## 2016-09-10 ENCOUNTER — Emergency Department (HOSPITAL_COMMUNITY)
Admission: EM | Admit: 2016-09-10 | Discharge: 2016-09-10 | Disposition: A | Discharge status: Home / Self Care | Payer: BLUE CROSS/BLUE SHIELD | Attending: Emergency Medicine | Admitting: Emergency Medicine

## 2016-09-10 DIAGNOSIS — Z87891 Personal history of nicotine dependence: Secondary | ICD-10-CM | POA: Insufficient documentation

## 2016-09-10 DIAGNOSIS — R0982 Postnasal drip: Secondary | ICD-10-CM | POA: Insufficient documentation

## 2016-09-10 DIAGNOSIS — Z79899 Other long term (current) drug therapy: Secondary | ICD-10-CM | POA: Diagnosis not present

## 2016-09-10 NOTE — Discharge Instructions (Signed)
Please read attached information. If you experience any new or worsening signs or symptoms please return to the emergency room for evaluation. Please follow-up with your primary care provider or specialist as discussed. Please continue using Flonase and Claritin.

## 2016-09-10 NOTE — ED Triage Notes (Signed)
Patient is complaining of sinus drainage and nasal drainage x 1 month.

## 2016-09-10 NOTE — ED Provider Notes (Signed)
[facility] DEPT Provider Note   CSN: [id number] Arrival date & time: 09/10/16  1431  By signing my name below, I, Julien Nordmann, attest that this documentation has been prepared under the direction and in the presence of [employer], PA-C.  Electronically Signed: Julien Nordmann, ED Scribe. 09/10/16. 4:47 PM.    History   Chief Complaint Chief Complaint  Patient presents with  . Nasal Congestion   The history is provided by the patient. No language interpreter was used.   HPI Comments: Marvin Lam is a 46 y.o. male who presents to the Emergency Department complaining of intermittent, moderate, post nasal drainage x 1 month. He notes having more discomfort for the past several days. He reports associated rhinorrhea. Pt has been seen multiple times in the past for same complaint. Pt has been using claritin and flonase to alleviate his symptoms without relief. He denies fever and sore throat.   Past Medical History:  Diagnosis Date  . Glaucoma   . UTI (lower urinary tract infection)     Patient Active Problem List   Diagnosis Date Noted  . DYSLIPIDEMIA 03/09/2007  . GLAUCOMA NOS 03/09/2007  . DYSURIA 03/09/2007    Past Surgical History:  Procedure Laterality Date  . EYE SURGERY    . LASIK        Home Medications    Prior to Admission medications   Medication Sig Start Date End Date Taking? Authorizing Provider  azithromycin (ZITHROMAX) 250 MG tablet Take 1 tablet (250 mg total) by mouth daily. Take first 2 tablets together, then 1 every day until finished. Patient not taking: Reported on 09/06/2015 08/19/15   Fransico Meadow, PA-C  bimatoprost (LUMIGAN) 0.03 % ophthalmic solution Place 1 drop into both eyes at bedtime.    Historical Provider, MD  fluticasone (FLONASE) 50 MCG/ACT nasal spray Place 1 spray into both nostrils daily. 01/04/16   Shary Decamp, PA-C  fluticasone (FLONASE) 50 MCG/ACT nasal spray Place 2 sprays into both nostrils daily. 02/28/16   [place], PA-C  guaiFENesin-codeine 100-10 MG/5ML syrup Take 10 mLs by mouth every 6 (six) hours as needed for cough. 09/06/15   Junius Creamer, NP  levocetirizine (XYZAL) 5 MG tablet Take 1 tablet (5 mg total) by mouth every evening. 02/28/16   [address], PA-C  Omega-3 Fatty Acids (FISH OIL PO) Take 1 capsule by mouth daily.    Historical Provider, MD    Family History No family history on file.  Social History Social History  Substance Use Topics  . Smoking status: Former Research scientist (life sciences)  . Smokeless tobacco: Never Used  . Alcohol use No     Allergies   Patient has no known allergies.   Review of Systems Review of Systems  A complete 10 system review of systems was obtained and all systems are negative except as noted in the HPI and PMH.   Physical Exam Updated Vital Signs BP 125/88 (BP Location: Right Arm)   Pulse 87   Temp 98.3 F (36.8 C) (Oral)   Resp 18   Ht 5\' 11"  (1.803 m)   Wt 90.7 kg   SpO2 97%   BMI 27.89 kg/m   Physical Exam  Constitutional: He is oriented to person, place, and time. He appears well-developed and well-nourished.  HENT:  Head: Normocephalic.  Mouth/Throat: Uvula is midline, oropharynx is clear and moist and mucous membranes are normal.  Bilateral mucosal edema  Eyes: EOM are normal.  Neck: Normal range of motion.  Pulmonary/Chest: Effort  normal.  Abdominal: He exhibits no distension.  Musculoskeletal: Normal range of motion.  Neurological: He is alert and oriented to person, place, and time.  Psychiatric: He has a normal mood and affect.  Nursing note and vitals reviewed.    ED Treatments / Results  DIAGNOSTIC STUDIES: Oxygen Saturation is 98% on RA, normal by my interpretation.  COORDINATION OF CARE:  4:28 PM Discussed treatment plan with pt at bedside and pt agreed to plan.  Labs (all labs ordered are listed, but only abnormal results are displayed) Labs Reviewed - No data to display  EKG  EKG Interpretation None        Radiology No results found.  Procedures Procedures (including critical care time)  Medications Ordered in ED Medications - No data to display   Initial Impression / Assessment and Plan / ED Course  I have reviewed the triage vital signs and the nursing notes.  Pertinent labs & imaging results that were available during my care of the patient were reviewed by me and considered in my medical decision making (see chart for details).     Final Clinical Impressions(s) / ED Diagnoses   Final diagnoses:  Post-nasal drainage   Labs: n/a  Imaging: n/a  Consults: n/a  Therapeutics: n/a  Discharge Meds: n/a  Assessment/Plan: Patient presentation most consistent with postnasal drainage. Patient is been seen for this in the past, reports no significant improvement with antihistamines and nasal sprays. Patient has no other concerning signs or symptoms, he will be referred to ENT for further evaluation and management. Return precautions given.  I personally performed the services described in this documentation, which was scribed in my presence. The recorded information has been reviewed and is accurate.  New Prescriptions Discharge Medication List as of 09/10/2016  4:47 PM       Okey Regal, PA-C 09/10/16 2016    Virgel Manifold, MD 09/18/16 [phone]

## 2017-09-02 ENCOUNTER — Emergency Department (HOSPITAL_COMMUNITY)
Admission: EM | Admit: 2017-09-02 | Discharge: 2017-09-03 | Disposition: A | Discharge status: Home / Self Care | Payer: BLUE CROSS/BLUE SHIELD | Attending: Emergency Medicine | Admitting: Emergency Medicine

## 2017-09-02 ENCOUNTER — Other Ambulatory Visit: Payer: Self-pay

## 2017-09-02 ENCOUNTER — Encounter (HOSPITAL_COMMUNITY): Payer: Self-pay

## 2017-09-02 DIAGNOSIS — Z79899 Other long term (current) drug therapy: Secondary | ICD-10-CM | POA: Insufficient documentation

## 2017-09-02 DIAGNOSIS — R1032 Left lower quadrant pain: Secondary | ICD-10-CM | POA: Diagnosis not present

## 2017-09-02 DIAGNOSIS — Z87891 Personal history of nicotine dependence: Secondary | ICD-10-CM | POA: Insufficient documentation

## 2017-09-02 DIAGNOSIS — E785 Hyperlipidemia, unspecified: Secondary | ICD-10-CM | POA: Insufficient documentation

## 2017-09-02 MED ORDER — SODIUM CHLORIDE 0.9 % IV BOLUS (SEPSIS)
1000.0000 mL | Freq: Once | INTRAVENOUS | Status: AC
  Administered 2017-09-03: 1000 mL via INTRAVENOUS

## 2017-09-02 NOTE — ED Triage Notes (Signed)
States pressure in left lower quad for 2 days no nausea or vomiting noted states more of a pressure than pain no fever noted.

## 2017-09-02 NOTE — ED Notes (Signed)
Bed: LE17 Expected date:  Expected time:  Means of arrival:  Comments: Triage 1 [facility]

## 2017-09-02 NOTE — ED Provider Notes (Signed)
[facility] DEPT Provider Note   CSN: [id number] Arrival date & time: 09/02/17  2116     History   Chief Complaint Chief Complaint  Patient presents with  . Abdominal Pain    HPI Marvin Lam is a 47 y.o. male.  Patient is a 47 year old male with no significant past medical history.  He presents with a 2-day history of left lower quadrant abdominal pain.  He describes it as a "full feeling".  He denies any fevers, chills, bloody stools, diarrhea.  Symptoms are worse with change in position and palpation.   The history is provided by the patient.  Abdominal Pain   This is a new problem. The current episode started 2 days ago. The problem occurs constantly. The problem has been gradually worsening. The pain is associated with an unknown factor. The pain is located in the LLQ. The quality of the pain is pressure-like. The pain is moderate. Pertinent negatives include fever, hematochezia, melena, constipation, dysuria, frequency and hematuria. The symptoms are aggravated by certain positions and palpation. Nothing relieves the symptoms.    Past Medical History:  Diagnosis Date  . Glaucoma   . UTI (lower urinary tract infection)     Patient Active Problem List   Diagnosis Date Noted  . DYSLIPIDEMIA 03/09/2007  . GLAUCOMA NOS 03/09/2007  . DYSURIA 03/09/2007    Past Surgical History:  Procedure Laterality Date  . EYE SURGERY    . LASIK         Home Medications    Prior to Admission medications   Medication Sig Start Date End Date Taking? Authorizing Provider  azithromycin (ZITHROMAX) 250 MG tablet Take 1 tablet (250 mg total) by mouth daily. Take first 2 tablets together, then 1 every day until finished. Patient not taking: Reported on 09/06/2015 08/19/15   Fransico Meadow, PA-C  bimatoprost (LUMIGAN) 0.03 % ophthalmic solution Place 1 drop into both eyes at bedtime.    [provider]  fluticasone (FLONASE) 50 MCG/ACT nasal  spray Place 1 spray into both nostrils daily. 01/04/16   Shary Decamp, PA-C  fluticasone (FLONASE) 50 MCG/ACT nasal spray Place 2 sprays into both nostrils daily. 02/28/16   Street, [place], PA-C  guaiFENesin-codeine 100-10 MG/5ML syrup Take 10 mLs by mouth every 6 (six) hours as needed for cough. 09/06/15   Junius Creamer, NP  levocetirizine (XYZAL) 5 MG tablet Take 1 tablet (5 mg total) by mouth every evening. 02/28/16   Street, [place], [place]  Omega-3 Fatty Acids (FISH OIL PO) Take 1 capsule by mouth daily.    [provider]    Family History History reviewed. No pertinent family history.  Social History Social History   Tobacco Use  . Smoking status: Former Research scientist (life sciences)  . Smokeless tobacco: Never Used  Substance Use Topics  . Alcohol use: No  . Drug use: No     Allergies   Patient has no known allergies.   Review of Systems Review of Systems  Constitutional: Negative for fever.  Gastrointestinal: Positive for abdominal pain. Negative for constipation, hematochezia and melena.  Genitourinary: Negative for dysuria, frequency and hematuria.  All other systems reviewed and are negative.    Physical Exam Updated Vital Signs BP 126/87 (BP Location: Left Arm)   Pulse 85   Temp 98.6 F (37 C) (Oral)   Resp 18   Ht 5\' 10"  (1.778 m)   Wt 105.2 kg (232 lb)   SpO2 99%   BMI 33.29 kg/m  Physical Exam  Constitutional: He is oriented to person, place, and time. He appears well-developed and well-nourished. No distress.  HENT:  Head: Normocephalic and atraumatic.  Mouth/Throat: Oropharynx is clear and moist.  Neck: Normal range of motion. Neck supple.  Cardiovascular: Normal rate and regular rhythm. Exam reveals no friction rub.  No murmur heard. Pulmonary/Chest: Effort normal and breath sounds normal. No respiratory distress. He has no wheezes. He has no rales.  Abdominal: Soft. Bowel sounds are normal. He exhibits no distension. There is tenderness in the left lower  quadrant. There is no rigidity, no rebound and no guarding.  Musculoskeletal: Normal range of motion. He exhibits no edema.  Neurological: He is alert and oriented to person, place, and time. Coordination normal.  Skin: Skin is warm and dry. He is not diaphoretic.  Nursing note and vitals reviewed.    ED Treatments / Results  Labs (all labs ordered are listed, but only abnormal results are displayed) Labs Reviewed  URINALYSIS, ROUTINE W REFLEX MICROSCOPIC  BASIC METABOLIC PANEL  CBC WITH DIFFERENTIAL/PLATELET    EKG  EKG Interpretation None       Radiology No results found.  Procedures Procedures (including critical care time)  Medications Ordered in ED Medications  sodium chloride 0.9 % bolus 1,000 mL (not administered)     Initial Impression / Assessment and Plan / ED Course  I have reviewed the triage vital signs and the nursing notes.  Pertinent labs & imaging results that were available during my care of the patient were reviewed by me and considered in my medical decision making (see chart for details).  Patient presents with left lower quadrant abdominal pain.  His workup reveals no acute process.  He has no white count and no evidence of diverticulitis.  Urinalysis is clear.  He does report starting a workout routine at the gym and I suspect a musculoskeletal etiology.  He will be discharged, to follow-up as needed for any problems.  Final Clinical Impressions(s) / ED Diagnoses   Final diagnoses:  None    ED Discharge Orders    None       Veryl Speak, MD 09/03/17 [phone]

## 2017-09-03 ENCOUNTER — Encounter (HOSPITAL_COMMUNITY): Payer: Self-pay

## 2017-09-03 ENCOUNTER — Emergency Department (HOSPITAL_COMMUNITY): Payer: BLUE CROSS/BLUE SHIELD

## 2017-09-03 LAB — CBC WITH DIFFERENTIAL/PLATELET
Basophils Absolute: 0 10*3/uL (ref 0.0–0.1)
Basophils Relative: 1 %
EOS ABS: 0.1 10*3/uL (ref 0.0–0.7)
Eosinophils Relative: 2 %
HCT: 45.4 % (ref 39.0–52.0)
HEMOGLOBIN: 15.3 g/dL (ref 13.0–17.0)
Lymphocytes Relative: 37 %
Lymphs Abs: 2.2 10*3/uL (ref 0.7–4.0)
MCH: 32.3 pg (ref 26.0–34.0)
MCHC: 33.7 g/dL (ref 30.0–36.0)
MCV: 95.8 fL (ref 78.0–100.0)
Monocytes Absolute: 0.5 10*3/uL (ref 0.1–1.0)
Monocytes Relative: 9 %
NEUTROS PCT: 51 %
Neutro Abs: 3 10*3/uL (ref 1.7–7.7)
Platelets: 321 10*3/uL (ref 150–400)
RBC: 4.74 MIL/uL (ref 4.22–5.81)
RDW: 12.7 % (ref 11.5–15.5)
WBC: 5.9 10*3/uL (ref 4.0–10.5)

## 2017-09-03 LAB — URINALYSIS, ROUTINE W REFLEX MICROSCOPIC
Bilirubin Urine: NEGATIVE
Glucose, UA: NEGATIVE mg/dL
Hgb urine dipstick: NEGATIVE
Ketones, ur: NEGATIVE mg/dL
LEUKOCYTES UA: NEGATIVE
Nitrite: NEGATIVE
Protein, ur: NEGATIVE mg/dL
Specific Gravity, Urine: 1.021 (ref 1.005–1.030)
pH: 6 (ref 5.0–8.0)

## 2017-09-03 LAB — BASIC METABOLIC PANEL
Anion gap: 7 (ref 5–15)
BUN: 19 mg/dL (ref 6–20)
CO2: 27 mmol/L (ref 22–32)
CREATININE: 0.98 mg/dL (ref 0.61–1.24)
Calcium: 9.6 mg/dL (ref 8.9–10.3)
Chloride: 104 mmol/L (ref 101–111)
GFR calc non Af Amer: >60 mL/min (ref >60)
Glucose, Bld: 89 mg/dL (ref 65–99)
Potassium: 4 mmol/L (ref 3.5–5.1)
SODIUM: 138 mmol/L (ref 135–145)

## 2017-09-03 MED ORDER — IOPAMIDOL (ISOVUE-300) INJECTION 61%
INTRAVENOUS | Status: AC
  Administered 2017-09-03: 100 mL via INTRAVENOUS
  Filled 2017-09-03: qty 100

## 2017-09-03 MED ORDER — IOPAMIDOL (ISOVUE-300) INJECTION 61%
100.0000 mL | Freq: Once | INTRAVENOUS | Status: AC | PRN
  Administered 2017-09-03: 100 mL via INTRAVENOUS

## 2017-09-03 NOTE — Discharge Instructions (Signed)
Ibuprofen 600 mg every 6 hours as needed for pain.  Simethicone as needed for gas/distention.  Return to the emergency department if you develop worsening pain, high fever, bloody stool, or other new and concerning symptoms.

## 2019-01-26 ENCOUNTER — Other Ambulatory Visit: Payer: Self-pay

## 2019-01-26 ENCOUNTER — Encounter (HOSPITAL_COMMUNITY): Payer: Self-pay

## 2019-01-26 ENCOUNTER — Emergency Department (HOSPITAL_COMMUNITY)
Admission: EM | Admit: 2019-01-26 | Discharge: 2019-01-26 | Disposition: A | Discharge status: Home / Self Care | Payer: 59 | Attending: Emergency Medicine | Admitting: Emergency Medicine

## 2019-01-26 DIAGNOSIS — R369 Urethral discharge, unspecified: Secondary | ICD-10-CM | POA: Insufficient documentation

## 2019-01-26 DIAGNOSIS — N342 Other urethritis: Secondary | ICD-10-CM | POA: Insufficient documentation

## 2019-01-26 DIAGNOSIS — R309 Painful micturition, unspecified: Secondary | ICD-10-CM | POA: Insufficient documentation

## 2019-01-26 DIAGNOSIS — Z79899 Other long term (current) drug therapy: Secondary | ICD-10-CM | POA: Diagnosis not present

## 2019-01-26 DIAGNOSIS — R300 Dysuria: Secondary | ICD-10-CM | POA: Diagnosis present

## 2019-01-26 DIAGNOSIS — Z87891 Personal history of nicotine dependence: Secondary | ICD-10-CM | POA: Diagnosis not present

## 2019-01-26 MED ORDER — CEFTRIAXONE SODIUM 250 MG IJ SOLR
250.0000 mg | Freq: Once | INTRAMUSCULAR | Status: AC
  Administered 2019-01-26: 250 mg via INTRAMUSCULAR
  Filled 2019-01-26: qty 250

## 2019-01-26 MED ORDER — METRONIDAZOLE 500 MG PO TABS
2000.0000 mg | ORAL_TABLET | Freq: Once | ORAL | Status: AC
Start: 2019-01-26 — End: 2019-01-26
  Administered 2019-01-26: 2000 mg via ORAL
  Filled 2019-01-26: qty 4

## 2019-01-26 MED ORDER — AZITHROMYCIN 250 MG PO TABS
1000.0000 mg | ORAL_TABLET | Freq: Once | ORAL | Status: AC
  Administered 2019-01-26: 1000 mg via ORAL
  Filled 2019-01-26: qty 4

## 2019-01-26 MED ORDER — STERILE WATER FOR INJECTION IJ SOLN
INTRAMUSCULAR | Status: AC
  Administered 2019-01-26: 10:00:00 0.9 mL
  Filled 2019-01-26: qty 10

## 2019-01-26 NOTE — Discharge Instructions (Addendum)
Return here as needed. °

## 2019-01-26 NOTE — ED Triage Notes (Signed)
He reports urinary urgency and dysuria x 1-2 weeks. He also reports occasional "clear or white" urethral d/c. He denies fever, nor any other sign of current illness and is in no distress.

## 2019-01-27 NOTE — ED Provider Notes (Signed)
[facility] DEPT Provider Note   CSN: [id number] Arrival date & time: 01/26/19  1700     History   Chief Complaint Chief Complaint  Patient presents with  . Dysuria    HPI Marvin Lam is a 48 y.o. male.     HPI Patient presents to the emergency department with urinary urgency and pain with urination.  The patient states he does have some white discharge from his urethra.  The patient states he has had no other symptoms.  He states is been ongoing over the last 1 to 2 weeks.  The patient denies chest pain, shortness of breath, headache,blurred vision, neck pain, fever, cough, weakness, numbness, dizziness, anorexia, edema, abdominal pain, nausea, vomiting, diarrhea, rash, back pain, hematemesis, bloody stool, near syncope, or syncope. Past Medical History:  Diagnosis Date  . Glaucoma   . UTI (lower urinary tract infection)     Patient Active Problem List   Diagnosis Date Noted  . DYSLIPIDEMIA 03/09/2007  . GLAUCOMA NOS 03/09/2007  . DYSURIA 03/09/2007    Past Surgical History:  Procedure Laterality Date  . EYE SURGERY    . LASIK          Home Medications    Prior to Admission medications   Medication Sig Start Date End Date Taking? Authorizing Provider  bimatoprost (LUMIGAN) 0.03 % ophthalmic solution Place 1 drop into both eyes at bedtime.   Yes [provider]  fluticasone (FLONASE) 50 MCG/ACT nasal spray Place 1 spray into both nostrils daily. Patient not taking: Reported on 09/03/2017 01/04/16   Shary Decamp, PA-C  guaiFENesin-codeine 100-10 MG/5ML syrup Take 10 mLs by mouth every 6 (six) hours as needed for cough. Patient not taking: Reported on 09/03/2017 09/06/15   Junius Creamer, NP  levocetirizine (XYZAL) 5 MG tablet Take 1 tablet (5 mg total) by mouth every evening. Patient not taking: Reported on 09/03/2017 02/28/16   Street, [place], PA-C    Family History No family history on file.  Social History Social History   Tobacco Use  . Smoking status: Former Research scientist (life sciences)  . Smokeless tobacco: Never Used  Substance Use Topics  . Alcohol use: No  . Drug use: No     Allergies   Patient has no known allergies.   Review of Systems Review of Systems All other systems negative except as documented in the HPI. All pertinent positives and negatives as reviewed in the HPI.  Physical Exam Updated Vital Signs BP (!) 159/107   Pulse 92   Temp 97.6 F (36.4 C) (Oral)   Resp 18   SpO2 100%   Physical Exam Vitals signs and nursing note reviewed.  Constitutional:      General: He is not in acute distress.    Appearance: He is well-developed.  HENT:     Head: Normocephalic and atraumatic.  Eyes:     Pupils: Pupils are equal, round, and reactive to light.  Pulmonary:     Effort: Pulmonary effort is normal.  Genitourinary:    Penis: Discharge present.   Skin:    General: Skin is warm and dry.  Neurological:     Mental Status: He is alert and oriented to person, place, and time.      ED Treatments / Results  Labs (all labs ordered are listed, but only abnormal results are displayed) Labs Reviewed  GC/CHLAMYDIA PROBE AMP ([place]) NOT AT [facility]    EKG    Radiology No results found.  Procedures Procedures (including  critical care time)  Medications Ordered in ED Medications  cefTRIAXone (ROCEPHIN) injection 250 mg (250 mg Intramuscular Given 01/26/19 1024)  azithromycin (ZITHROMAX) tablet 1,000 mg (1,000 mg Oral Given 01/26/19 1022)  metroNIDAZOLE (FLAGYL) tablet 2,000 mg (2,000 mg Oral Given 01/26/19 1022)  sterile water (preservative free) injection (0.9 mLs  Given 01/26/19 1027)     Initial Impression / Assessment and Plan / ED Course  I have reviewed the triage vital signs and the nursing notes.  Pertinent labs & imaging results that were available during my care of the patient were reviewed by me and considered in my medical decision making (see chart for details).         Patient be treated for STDs based on his physical exam findings.  I advised him to return here for any worsening in his condition.  Final Clinical Impressions(s) / ED Diagnoses   Final diagnoses:  Urethritis    ED Discharge Orders    None       Rebeca Allegra 01/27/19 2156    Lajean Saver, MD 01/31/19 1026

## 2019-01-28 LAB — GC/CHLAMYDIA PROBE AMP (~~LOC~~) NOT AT ARMC
Chlamydia: POSITIVE — AB
Neisseria Gonorrhea: NEGATIVE

## 2019-02-05 ENCOUNTER — Other Ambulatory Visit: Payer: Self-pay

## 2019-02-05 ENCOUNTER — Encounter (HOSPITAL_COMMUNITY): Payer: Self-pay | Admitting: *Deleted

## 2019-02-05 ENCOUNTER — Emergency Department (HOSPITAL_COMMUNITY)
Admission: EM | Admit: 2019-02-05 | Discharge: 2019-02-05 | Disposition: A | Discharge status: Home / Self Care | Payer: 59 | Attending: Emergency Medicine | Admitting: Emergency Medicine

## 2019-02-05 DIAGNOSIS — R0981 Nasal congestion: Secondary | ICD-10-CM | POA: Diagnosis present

## 2019-02-05 DIAGNOSIS — Z87891 Personal history of nicotine dependence: Secondary | ICD-10-CM | POA: Diagnosis not present

## 2019-02-05 DIAGNOSIS — J019 Acute sinusitis, unspecified: Secondary | ICD-10-CM | POA: Insufficient documentation

## 2019-02-05 MED ORDER — FLUTICASONE PROPIONATE 50 MCG/ACT NA SUSP: 1.0000 | Freq: Every day | NASAL | 0 refills | Status: DC

## 2019-02-05 MED ORDER — AMOXICILLIN 500 MG PO CAPS: 500.0000 mg | ORAL_CAPSULE | Freq: Three times a day (TID) | ORAL | 0 refills | Status: DC

## 2019-02-05 NOTE — Discharge Instructions (Signed)
You were seen in the emergency department for nasal congestion sore throat blocked ears that is likely related to your sinuses.  We are prescribing you Flonase nasal spray to use to decrease the inflammation and a course of antibiotics.  Please drink plenty of fluids.  We are also giving the number for the ear nose throat clinic to schedule an appointment.  Please return if any worsening symptoms.

## 2019-02-05 NOTE — ED Notes (Signed)
Bed: WTR8 Expected date:  Expected time:  Means of arrival:  Comments: 

## 2019-02-05 NOTE — ED Notes (Signed)
Pt refused discharge vital signs

## 2019-02-05 NOTE — ED Triage Notes (Signed)
Pt complains of congestion in sinuses x 3 weeks. Pt states it is difficult to swallow d/t drainage. Pt denies cough or sneezing.

## 2019-02-05 NOTE — ED Provider Notes (Signed)
[name] Provider Note   CSN: [id number] Arrival date & time: 02/05/19  1718     History   Chief Complaint Chief Complaint  Patient presents with  . sinus congestion    HPI Marvin Lam is a 48 y.o. male.  He is complaining of 3 weeks of nasal congestion and postnasal drip with a bad taste in his mouth.  There is no fevers or chills.  He has had some blocked sensation in his ears.  He said he is been trying some over-the-counter nasal sprays without any improvement.  Denies any cough or shortness of breath.     The history is provided by the patient.  URI Presenting symptoms: congestion, ear pain, rhinorrhea and sore throat   Presenting symptoms: no fever   Severity:  Moderate Onset quality:  Gradual Timing:  Intermittent Progression:  Unchanged Chronicity:  New Relieved by:  Nothing Worsened by:  Nothing Ineffective treatments:  OTC medications Associated symptoms: sinus pain   Associated symptoms: no arthralgias, no headaches, no myalgias, no neck pain, no sneezing, no swollen glands and no wheezing   Risk factors: no immunosuppression     Past Medical History:  Diagnosis Date  . Glaucoma   . UTI (lower urinary tract infection)     Patient Active Problem List   Diagnosis Date Noted  . DYSLIPIDEMIA 03/09/2007  . GLAUCOMA NOS 03/09/2007  . DYSURIA 03/09/2007    Past Surgical History:  Procedure Laterality Date  . EYE SURGERY    . LASIK          Home Medications    Prior to Admission medications   Medication Sig Start Date End Date Taking? Authorizing Provider  bimatoprost (LUMIGAN) 0.03 % ophthalmic solution Place 1 drop into both eyes at bedtime.    [provider]  fluticasone (FLONASE) 50 MCG/ACT nasal spray Place 1 spray into both nostrils daily. Patient not taking: Reported on 09/03/2017 01/04/16   Shary Decamp, PA-C  guaiFENesin-codeine 100-10 MG/5ML syrup Take 10 mLs by mouth every 6 (six) hours as  needed for cough. Patient not taking: Reported on 09/03/2017 09/06/15   Junius Creamer, NP  levocetirizine (XYZAL) 5 MG tablet Take 1 tablet (5 mg total) by mouth every evening. Patient not taking: Reported on 09/03/2017 02/28/16   Street, [place], PA-C    Family History No family history on file.  Social History Social History   Tobacco Use  . Smoking status: Former Research scientist (life sciences)  . Smokeless tobacco: Never Used  Substance Use Topics  . Alcohol use: No  . Drug use: No     Allergies   Patient has no known allergies.   Review of Systems Review of Systems  Constitutional: Negative for fever.  HENT: Positive for congestion, ear pain, rhinorrhea, sinus pain and sore throat. Negative for sneezing.   Eyes: Negative for visual disturbance.  Respiratory: Negative for shortness of breath and wheezing.   Cardiovascular: Negative for chest pain.  Gastrointestinal: Negative for abdominal pain.  Genitourinary: Negative for dysuria.  Musculoskeletal: Negative for arthralgias, myalgias and neck pain.  Skin: Negative for rash.  Neurological: Negative for headaches.     Physical Exam Updated Vital Signs BP (!) 139/95 (BP Location: Left Arm)   Pulse (!) 102   Temp 98.6 F (37 C)   Resp 18   SpO2 98%   Physical Exam Vitals signs and nursing note reviewed.  Constitutional:      Appearance: He is well-developed.  HENT:  Head: Normocephalic and atraumatic.     Right Ear: Tympanic membrane and ear canal normal.     Left Ear: Tympanic membrane and ear canal normal.     Nose: Rhinorrhea present.     Mouth/Throat:     Pharynx: No oropharyngeal exudate.  Eyes:     Conjunctiva/sclera: Conjunctivae normal.     Pupils: Pupils are equal, round, and reactive to light.  Neck:     Musculoskeletal: Normal range of motion and neck supple.  Cardiovascular:     Rate and Rhythm: Normal rate and regular rhythm.     Heart sounds: No murmur.  Pulmonary:     Effort: Pulmonary effort is normal. No  respiratory distress.     Breath sounds: Normal breath sounds.  Abdominal:     Palpations: Abdomen is soft.     Tenderness: There is no abdominal tenderness.  Lymphadenopathy:     Cervical: No cervical adenopathy.  Skin:    General: Skin is warm and dry.     Capillary Refill: Capillary refill takes less than 2 seconds.  Neurological:     General: No focal deficit present.     Mental Status: He is alert.      ED Treatments / Results  Labs (all labs ordered are listed, but only abnormal results are displayed) Labs Reviewed - No data to display  EKG None  Radiology No results found.  Procedures Procedures (including critical care time)  Medications Ordered in ED Medications - No data to display   Initial Impression / Assessment and Plan / ED Course  I have reviewed the triage vital signs and the nursing notes.  Pertinent labs & imaging results that were available during my care of the patient were reviewed by me and considered in my medical decision making (see chart for details).        Final Clinical Impressions(s) / ED Diagnoses   Final diagnoses:  Acute non-recurrent sinusitis, unspecified location    ED Discharge Orders         Ordered    fluticasone (FLONASE) 50 MCG/ACT nasal spray  Daily     02/05/19 1934    amoxicillin (AMOXIL) 500 MG capsule  3 times daily     02/05/19 1934           Hayden Rasmussen, MD 02/05/19 2131

## 2019-02-11 ENCOUNTER — Other Ambulatory Visit: Payer: Self-pay

## 2019-02-11 ENCOUNTER — Ambulatory Visit (INDEPENDENT_AMBULATORY_CARE_PROVIDER_SITE_OTHER): Payer: 59 | Admitting: Family Medicine

## 2019-02-11 DIAGNOSIS — R0989 Other specified symptoms and signs involving the circulatory and respiratory systems: Secondary | ICD-10-CM | POA: Diagnosis not present

## 2019-02-11 MED ORDER — PANTOPRAZOLE SODIUM 40 MG PO TBEC: 40.0000 mg | DELAYED_RELEASE_TABLET | Freq: Every day | ORAL | 0 refills | Status: DC

## 2019-02-11 NOTE — Patient Instructions (Addendum)
Great to meet you! Continue flonase daily I am adding a medication to block acid production.  Let me know in 2-3 weeks if you are noticing a difference with this.

## 2019-02-11 NOTE — Assessment & Plan Note (Signed)
-  He will continue flonase daily, instructed on proper use.  -Suspect that reflux may be contributing as well.  Start protonix, he will let me know if having improvement with this in 2-3 weeks.

## 2019-02-11 NOTE — Progress Notes (Signed)
Marvin Lam - 48 y.o. male MRN [id number]  Date of birth: May 20, 1971  Subjective Chief Complaint  Patient presents with  . Establish Care    NP - Globlus sensation in throat , PND    HPI Marvin Lam is a 48 y.o. male here today with complaint of globus sensation in his throat.  Thought it may be due to post nasal drainage and was seen in ED where he was given antibiotics and flonase for sinus infection.  He has completed antibiotics and reports he is using flonase daily but has not noticed any improvement.  He denies difficulty swallowing but states "the mucus in the back of my throat is thick."  He admits to some reflux symptoms with sour taste in the mornings.  He denies abdominal pain, nausea, shortness of breath, or dysphagia.    ROS:  A comprehensive ROS was completed and negative except as noted per HPI  No Known Allergies  Past Medical History:  Diagnosis Date  . Glaucoma   . UTI (lower urinary tract infection)     Past Surgical History:  Procedure Laterality Date  . EYE SURGERY    . LASIK      Social History   Socioeconomic History  . Marital status: Single    Spouse name: Not on file  . Number of children: Not on file  . Years of education: Not on file  . Highest education level: Not on file  Occupational History  . Not on file  Social Needs  . Financial resource strain: Not hard at all  . Food insecurity    Worry: Never true    Inability: Never true  . Transportation needs    Medical: No    Non-medical: No  Tobacco Use  . Smoking status: Former Research scientist (life sciences)  . Smokeless tobacco: Never Used  Substance and Sexual Activity  . Alcohol use: No  . Drug use: No  . Sexual activity: Yes    Partners: Female    Birth control/protection: Condom  Lifestyle  . Physical activity    Days per week: Not on file    Minutes per session: Not on file  . Stress: Not on file  Relationships  . Social connections    Talks on phone: More than three times a week    Gets  together: More than three times a week    Attends religious service: Not on file    Active member of club or organization: Not on file    Attends meetings of clubs or organizations: Not on file    Relationship status: Never married  Other Topics Concern  . Not on file  Social History Narrative  . Not on file    Family History  Problem Relation Age of Onset  . Diabetes Mother   . Kidney disease Mother   . Glaucoma Father   . GER disease Father     Health Maintenance  Topic Date Due  . HIV Screening  08/05/1985  . TETANUS/TDAP  02/29/2016  . INFLUENZA VACCINE  03/01/2019    ----------------------------------------------------------------------------------------------------------------------------------------------------------------------------------------------------------------- Physical Exam BP 118/72   Pulse 90   Temp (!) 97.3 F (36.3 C) (Oral)   Resp 18   Ht 5\' 9"  (1.753 m)   Wt 224 lb (101.6 kg)   SpO2 95%   BMI 33.08 kg/m   Physical Exam Constitutional:      Appearance: Normal appearance.  HENT:     Head: Normocephalic and atraumatic.     Mouth/Throat:  Mouth: Mucous membranes are moist.  Eyes:     General: No scleral icterus. Neck:     Musculoskeletal: Neck supple.  Cardiovascular:     Rate and Rhythm: Normal rate and regular rhythm.  Pulmonary:     Effort: Pulmonary effort is normal.     Breath sounds: Normal breath sounds.  Skin:    General: Skin is warm and dry.  Neurological:     General: No focal deficit present.     Mental Status: He is alert.  Psychiatric:        Mood and Affect: Mood normal.        Behavior: Behavior normal.     ------------------------------------------------------------------------------------------------------------------------------------------------------------------------------------------------------------------- Assessment and Plan  Globus sensation -He will continue flonase daily, instructed on proper  use.  -Suspect that reflux may be contributing as well.  Start protonix, he will let me know if having improvement with this in 2-3 weeks.

## 2019-03-06 ENCOUNTER — Other Ambulatory Visit: Payer: Self-pay | Admitting: Family Medicine

## 2019-04-06 ENCOUNTER — Emergency Department (HOSPITAL_COMMUNITY)
Admission: EM | Admit: 2019-04-06 | Discharge: 2019-04-06 | Disposition: A | Discharge status: Home / Self Care | Payer: 59 | Attending: Emergency Medicine | Admitting: Emergency Medicine

## 2019-04-06 ENCOUNTER — Encounter (HOSPITAL_COMMUNITY): Payer: Self-pay

## 2019-04-06 ENCOUNTER — Other Ambulatory Visit: Payer: Self-pay

## 2019-04-06 DIAGNOSIS — Z113 Encounter for screening for infections with a predominantly sexual mode of transmission: Secondary | ICD-10-CM | POA: Diagnosis not present

## 2019-04-06 DIAGNOSIS — Z87891 Personal history of nicotine dependence: Secondary | ICD-10-CM | POA: Insufficient documentation

## 2019-04-06 DIAGNOSIS — R369 Urethral discharge, unspecified: Secondary | ICD-10-CM | POA: Insufficient documentation

## 2019-04-06 LAB — URINALYSIS, ROUTINE W REFLEX MICROSCOPIC
Bilirubin Urine: NEGATIVE
Glucose, UA: NEGATIVE mg/dL
Hgb urine dipstick: NEGATIVE
Ketones, ur: NEGATIVE mg/dL
Leukocytes,Ua: NEGATIVE
Nitrite: NEGATIVE
Protein, ur: NEGATIVE mg/dL
Specific Gravity, Urine: 1.014 (ref 1.005–1.030)
pH: 6 (ref 5.0–8.0)

## 2019-04-06 MED ORDER — CEFTRIAXONE SODIUM 250 MG IJ SOLR
250.0000 mg | Freq: Once | INTRAMUSCULAR | Status: AC
  Administered 2019-04-06: 250 mg via INTRAMUSCULAR
  Filled 2019-04-06: qty 250

## 2019-04-06 MED ORDER — METRONIDAZOLE 500 MG PO TABS
2000.0000 mg | ORAL_TABLET | Freq: Once | ORAL | Status: AC
  Administered 2019-04-06: 2000 mg via ORAL
  Filled 2019-04-06: qty 4

## 2019-04-06 MED ORDER — STERILE WATER FOR INJECTION IJ SOLN
INTRAMUSCULAR | Status: AC
  Administered 2019-04-06: 11:00:00 0.9 mL
  Filled 2019-04-06: qty 10

## 2019-04-06 MED ORDER — AZITHROMYCIN 1 G PO PACK
1.0000 g | PACK | Freq: Once | ORAL | Status: AC
  Administered 2019-04-06: 1 g via ORAL
  Filled 2019-04-06: qty 1

## 2019-04-06 NOTE — ED Triage Notes (Signed)
Pt states that he is concerned for a UTI. Pt states discomfort with urination. Pt describe clear pus. Pt denies any new sexual partners.

## 2019-04-06 NOTE — ED Notes (Signed)
Urine culture sent to lab with urine sample.  

## 2019-04-06 NOTE — Discharge Instructions (Signed)
You have been treated for gonorrhea, chlamydia, and trichomonas in the ER but the hospital will call you if lab is positive. NO SEXUAL INTERCOURSE FOR AT LEAST 10 DAYS AFTER TODAY'S VISIT, THIS WILL INVALIDATE YOUR TREATMENT HERE. DO NOT ENGAGE IN SEXUAL ACTIVITY UNTIL YOU FIND OUT ABOUT YOUR RESULTS AND HAVE PARTNERS TESTED AND TREATED. ALL PARTNERS MUST BE TESTED AND TREATED FOR STD'S. ALWAYS USE CONDOMS WHEN ENGAGING IN INTERCOURSE. Follow up with [facility] Department STD clinic for future STD concerns or screenings.  Follow up with your regular doctor in 1 week for recheck of symptoms.

## 2019-04-06 NOTE — ED Provider Notes (Signed)
[facility] DEPT Provider Note   CSN: [id number] Arrival date & time: 04/06/19  [id number]     History   Chief Complaint Chief Complaint  Patient presents with  . Urinary Tract Infection    HPI Marvin Lam is a 48 y.o. male presents to the ED today complaining of some clear discharge from his penis for the last 2 weeks.  Patient states that he tested positive for chlamydia a couple of months ago during a previous ED visit.  He states he was treated for the medication which resolved his symptoms at that point but it returned 2 weeks ago.  Patient states he is only sexually active with 1 male partner.  Denies fever, chills, dysuria, urinary frequency, abdominal pain, nausea, vomiting, penile pain, testicular swelling, testicular pain, any other associated symptoms.       Past Medical History:  Diagnosis Date  . Glaucoma   . UTI (lower urinary tract infection)     Patient Active Problem List   Diagnosis Date Noted  . Globus sensation 02/11/2019  . DYSLIPIDEMIA 03/09/2007  . GLAUCOMA NOS 03/09/2007  . DYSURIA 03/09/2007    Past Surgical History:  Procedure Laterality Date  . EYE SURGERY    . LASIK          Home Medications    Prior to Admission medications   Medication Sig Start Date End Date Taking? Authorizing Provider  amoxicillin (AMOXIL) 500 MG capsule Take 1 capsule (500 mg total) by mouth 3 (three) times daily. Patient not taking: Reported on 04/06/2019 02/05/19   Hayden Rasmussen, MD  fluticasone [facility]) 50 MCG/ACT nasal spray Place 1 spray into both nostrils daily. Patient not taking: Reported on 04/06/2019 02/05/19   Hayden Rasmussen, MD  guaiFENesin-codeine 100-10 MG/5ML syrup Take 10 mLs by mouth every 6 (six) hours as needed for cough. Patient not taking: Reported on 04/06/2019 09/06/15   Junius Creamer, NP  levocetirizine (XYZAL) 5 MG tablet Take 1 tablet (5 mg total) by mouth every evening. Patient not taking: Reported on 04/06/2019  02/28/16   Street, [place], PA-C  pantoprazole (PROTONIX) 40 MG tablet TAKE 1 TABLET BY MOUTH EVERY DAY Patient not taking: Reported on 04/06/2019 03/06/19   Luetta Nutting, DO    Family History Family History  Problem Relation Age of Onset  . Diabetes Mother   . Kidney disease Mother   . Glaucoma Father   . GER disease Father     Social History Social History   Tobacco Use  . Smoking status: Former Research scientist (life sciences)  . Smokeless tobacco: Never Used  Substance Use Topics  . Alcohol use: No  . Drug use: No     Allergies   Patient has no known allergies.   Review of Systems Review of Systems  Constitutional: Negative for chills and fever.  HENT: Negative for congestion.   Eyes: Negative for visual disturbance.  Respiratory: Negative for shortness of breath.   Cardiovascular: Negative for chest pain.  Gastrointestinal: Negative for abdominal pain, nausea and vomiting.  Genitourinary: Positive for discharge. Negative for difficulty urinating, flank pain, frequency, penile pain, penile swelling, scrotal swelling and testicular pain.  Musculoskeletal: Negative for myalgias.  Skin: Negative for rash.  Neurological: Negative for headaches.     Physical Exam Updated Vital Signs BP (!) 152/106 (BP Location: Right Arm)   Pulse 69   Temp 97.9 F (36.6 C) (Oral)   Resp 18   Wt 101 kg   SpO2 100%   BMI  32.89 kg/m   Physical Exam Vitals signs and nursing note reviewed.  Constitutional:      Appearance: He is not ill-appearing.  HENT:     Head: Normocephalic and atraumatic.  Eyes:     Conjunctiva/sclera: Conjunctivae normal.  Neck:     Musculoskeletal: Neck supple.  Cardiovascular:     Rate and Rhythm: Normal rate and regular rhythm.  Pulmonary:     Effort: Pulmonary effort is normal.     Breath sounds: Normal breath sounds.  Abdominal:     Palpations: Abdomen is soft.     Tenderness: There is no abdominal tenderness.  Genitourinary:    Comments: Chaperone present for  exam Jessica Lowdermilk, NT Circumcised penis without phimosis/paraphimosis, hypospadias, erythema, tenderness. Small amount of discharge noted to penis. No rashes or lesions. Testes with no masses or tenderness, no swelling, and cremasterics reflex present bilaterally. No abnormal lie. No inguinal hernias or adenopathy present.  Skin:    General: Skin is warm and dry.  Neurological:     Mental Status: He is alert.      ED Treatments / Results  Labs (all labs ordered are listed, but only abnormal results are displayed) Labs Reviewed  URINALYSIS, ROUTINE W REFLEX MICROSCOPIC - Abnormal; Notable for the following components:      Result Value   Color, Urine STRAW (*)    All other components within normal limits  GC/CHLAMYDIA PROBE AMP ([place]) NOT AT [facility]    EKG None  Radiology No results found.  Procedures Procedures (including critical care time)  Medications Ordered in ED Medications  cefTRIAXone (ROCEPHIN) injection 250 mg (has no administration in time range)  sterile water (preservative free) injection (has no administration in time range)  azithromycin (ZITHROMAX) powder 1 g (1 g Oral Given 04/06/19 1114)  metroNIDAZOLE (FLAGYL) tablet 2,000 mg (2,000 mg Oral Given 04/06/19 1112)     Initial Impression / Assessment and Plan / ED Course  I have reviewed the triage vital signs and the nursing notes.  Pertinent labs & imaging results that were available during my care of the patient were reviewed by me and considered in my medical decision making (see chart for details).    48 year old male who presents to the ED today complaining of some penile discharge for the past 2 weeks.  Recently tested positive for chlamydia on 6/28 during ED visit at that time for similar symptoms.  Was treated empirically for gonorrhea, chlamydia, trichomonas.  Tested positive for chlamydia 2 days later.  Has no penile pain, testicular pain, testicular swelling.  No concern for epididymitis,  orchitis, torsion today.  Will obtain urinalysis and swab for gonorrhea/chlamydia with urine sample.  We will again treat empirically today for same.  Patient declines HIV and syphilis testing today.  He reports that he was tested approximately a months ago by PCP.  I had lengthy discussion with patient regarding the fact that he could be positive today even with a negative test a few months ago.  Patient is understanding and continues to decline.   U/A without infection. Pt treated with azithromycin, rocephin, and flagyl.  Last refrain from intercourse for the next 10 days.  Patient to have all partners tested and treated as well.  He will receive a call in 2 to 3 days if he test positive.  Otherwise he will not receive a call from [place].  To follow-up with PCP.  He is in agreement with plan and stable for discharge at this time.  This note was prepared using Dragon voice recognition software and may include unintentional dictation errors due to the inherent limitations of voice recognition software.       Final Clinical Impressions(s) / ED Diagnoses   Final diagnoses:  Penile discharge  Screening for STD (sexually transmitted disease)    ED Discharge Orders    None       Eustaquio Maize, PA-C 04/06/19 1117    Daleen Bo, MD 04/08/19 [phone]

## 2019-04-27 ENCOUNTER — Emergency Department (HOSPITAL_COMMUNITY)
Admission: EM | Admit: 2019-04-27 | Discharge: 2019-04-27 | Disposition: A | Discharge status: Home / Self Care | Payer: 59 | Attending: Emergency Medicine | Admitting: Emergency Medicine

## 2019-04-27 ENCOUNTER — Encounter (HOSPITAL_COMMUNITY): Payer: Self-pay | Admitting: Emergency Medicine

## 2019-04-27 ENCOUNTER — Other Ambulatory Visit: Payer: Self-pay

## 2019-04-27 DIAGNOSIS — R300 Dysuria: Secondary | ICD-10-CM | POA: Diagnosis present

## 2019-04-27 DIAGNOSIS — Z87891 Personal history of nicotine dependence: Secondary | ICD-10-CM | POA: Diagnosis not present

## 2019-04-27 DIAGNOSIS — N341 Nonspecific urethritis: Secondary | ICD-10-CM | POA: Diagnosis not present

## 2019-04-27 DIAGNOSIS — N342 Other urethritis: Secondary | ICD-10-CM

## 2019-04-27 LAB — URINALYSIS, ROUTINE W REFLEX MICROSCOPIC
Bilirubin Urine: NEGATIVE
Glucose, UA: NEGATIVE mg/dL
Hgb urine dipstick: NEGATIVE
Ketones, ur: NEGATIVE mg/dL
Leukocytes,Ua: NEGATIVE
Nitrite: NEGATIVE
Protein, ur: NEGATIVE mg/dL
Specific Gravity, Urine: 1.005 (ref 1.005–1.030)
pH: 6 (ref 5.0–8.0)

## 2019-04-27 MED ORDER — DOXYCYCLINE HYCLATE 100 MG PO CAPS: 100.0000 mg | ORAL_CAPSULE | Freq: Two times a day (BID) | ORAL | 0 refills | Status: DC

## 2019-04-27 MED ORDER — CEPHALEXIN 500 MG PO CAPS: 500.0000 mg | ORAL_CAPSULE | Freq: Three times a day (TID) | ORAL | 0 refills | Status: DC

## 2019-04-27 NOTE — ED Triage Notes (Signed)
Pt c/o pain with urination for about month. Was recently treated for STDs.

## 2019-04-28 LAB — URINE CULTURE: Culture: NO GROWTH

## 2019-04-29 NOTE — ED Provider Notes (Signed)
[facility] DEPT Provider Note   CSN: [id number] Arrival date & time: 04/27/19  1719     History   Chief Complaint Chief Complaint  Patient presents with  . Dysuria    HPI Marvin Lam is a 48 y.o. male.     HPI  48 y/o comes in with burning with pain. He reports that he has some discomfort with urination and burning feeling at the tip. He was treated for STDs few months ago, but he states that his symptoms returned within days of stopping antibiotics. He denies any rash and discharge.   Past Medical History:  Diagnosis Date  . Glaucoma   . UTI (lower urinary tract infection)     Patient Active Problem List   Diagnosis Date Noted  . Globus sensation 02/11/2019  . DYSLIPIDEMIA 03/09/2007  . GLAUCOMA NOS 03/09/2007  . DYSURIA 03/09/2007    Past Surgical History:  Procedure Laterality Date  . EYE SURGERY    . LASIK          Home Medications    Prior to Admission medications   Medication Sig Start Date End Date Taking? Authorizing Provider  amoxicillin (AMOXIL) 500 MG capsule Take 1 capsule (500 mg total) by mouth 3 (three) times daily. Patient not taking: Reported on 04/06/2019 02/05/19   Hayden Rasmussen, MD  cephALEXin (KEFLEX) 500 MG capsule Take 1 capsule (500 mg total) by mouth 3 (three) times daily. 04/27/19   Varney Biles, MD  doxycycline (VIBRAMYCIN) 100 MG capsule Take 1 capsule (100 mg total) by mouth 2 (two) times daily. 04/27/19   Varney Biles, MD  fluticasone (FLONASE) 50 MCG/ACT nasal spray Place 1 spray into both nostrils daily. Patient not taking: Reported on 04/06/2019 02/05/19   Hayden Rasmussen, MD  guaiFENesin-codeine 100-10 MG/5ML syrup Take 10 mLs by mouth every 6 (six) hours as needed for cough. Patient not taking: Reported on 04/06/2019 09/06/15   Junius Creamer, NP  levocetirizine (XYZAL) 5 MG tablet Take 1 tablet (5 mg total) by mouth every evening. Patient not taking: Reported on 04/06/2019 02/28/16   Street,  [place], PA-C  pantoprazole (PROTONIX) 40 MG tablet TAKE 1 TABLET BY MOUTH EVERY DAY Patient not taking: Reported on 04/06/2019 03/06/19   Luetta Nutting, DO    Family History Family History  Problem Relation Age of Onset  . Diabetes Mother   . Kidney disease Mother   . Glaucoma Father   . GER disease Father     Social History Social History   Tobacco Use  . Smoking status: Former Research scientist (life sciences)  . Smokeless tobacco: Never Used  Substance Use Topics  . Alcohol use: No  . Drug use: No     Allergies   Patient has no known allergies.   Review of Systems Review of Systems  Constitutional: Negative for activity change.  Genitourinary: Positive for dysuria.  Skin: Negative for rash.  Allergic/Immunologic: Negative for immunocompromised state.     Physical Exam Updated Vital Signs BP 114/81   Pulse 81   Temp 98.9 F (37.2 C)   Resp 17   SpO2 99%   Physical Exam Vitals signs and nursing note reviewed.  Constitutional:      Appearance: He is well-developed.  Cardiovascular:     Rate and Rhythm: Normal rate.  Pulmonary:     Effort: Pulmonary effort is normal.  Genitourinary:    Penis: Normal.      Scrotum/Testes: Normal.     Comments: No urethral discharge  Skin:    General: Skin is warm.  Neurological:     Mental Status: He is alert.      ED Treatments / Results  Labs (all labs ordered are listed, but only abnormal results are displayed) Labs Reviewed  URINALYSIS, ROUTINE W REFLEX MICROSCOPIC - Abnormal; Notable for the following components:      Result Value   Color, Urine COLORLESS (*)    All other components within normal limits  URINE CULTURE  GC/CHLAMYDIA PROBE AMP () NOT AT [facility]    EKG None  Radiology No results found.  Procedures Procedures (including critical care time)  Medications Ordered in ED Medications - No data to display   Initial Impression / Assessment and Plan / ED Course  I have reviewed the triage vital signs and  the nursing notes.  Pertinent labs & imaging results that were available during my care of the patient were reviewed by me and considered in my medical decision making (see chart for details).        Dysuria x few days. Hx of chlamydia. States symptoms returned few days after treatment.  UA is clean. Added GC and Chlamydia to the urine. We will treat with oral course for chlamydia. Patient wants to get UTI meds as well, as he doesn't think he has STD.  Advised to follow up with health dept if not getting better. He has declined hiv screen.  Final Clinical Impressions(s) / ED Diagnoses   Final diagnoses:  Urethritis    ED Discharge Orders         Ordered    doxycycline (VIBRAMYCIN) 100 MG capsule  2 times daily     04/27/19 1842    cephALEXin (KEFLEX) 500 MG capsule  3 times daily     04/27/19 1842           Varney Biles, MD 04/29/19 2223

## 2020-03-03 ENCOUNTER — Other Ambulatory Visit: Payer: Self-pay

## 2020-03-03 ENCOUNTER — Emergency Department (HOSPITAL_COMMUNITY)
Admission: EM | Admit: 2020-03-03 | Discharge: 2020-03-03 | Disposition: A | Discharge status: Home / Self Care | Payer: 59 | Attending: Emergency Medicine | Admitting: Emergency Medicine

## 2020-03-03 ENCOUNTER — Encounter (HOSPITAL_COMMUNITY): Payer: Self-pay | Admitting: Obstetrics and Gynecology

## 2020-03-03 DIAGNOSIS — Z87891 Personal history of nicotine dependence: Secondary | ICD-10-CM | POA: Diagnosis not present

## 2020-03-03 DIAGNOSIS — R300 Dysuria: Secondary | ICD-10-CM | POA: Diagnosis not present

## 2020-03-03 DIAGNOSIS — R350 Frequency of micturition: Secondary | ICD-10-CM | POA: Diagnosis present

## 2020-03-03 LAB — URINALYSIS, ROUTINE W REFLEX MICROSCOPIC
Bacteria, UA: NONE SEEN
Bilirubin Urine: NEGATIVE
Glucose, UA: NEGATIVE mg/dL
Hgb urine dipstick: NEGATIVE
Ketones, ur: NEGATIVE mg/dL
Nitrite: NEGATIVE
Protein, ur: NEGATIVE mg/dL
Specific Gravity, Urine: 1.018 (ref 1.005–1.030)
pH: 8 (ref 5.0–8.0)

## 2020-03-03 MED ORDER — CEFTRIAXONE SODIUM 1 G IJ SOLR
500.0000 mg | Freq: Once | INTRAMUSCULAR | Status: AC
  Administered 2020-03-03: 500 mg via INTRAMUSCULAR
  Filled 2020-03-03: qty 10

## 2020-03-03 MED ORDER — STERILE WATER FOR INJECTION IJ SOLN
INTRAMUSCULAR | Status: AC
  Filled 2020-03-03: qty 10

## 2020-03-03 MED ORDER — DOXYCYCLINE HYCLATE 100 MG PO TABS
100.0000 mg | ORAL_TABLET | Freq: Once | ORAL | Status: AC
  Administered 2020-03-03: 100 mg via ORAL
  Filled 2020-03-03: qty 1

## 2020-03-03 MED ORDER — DOXYCYCLINE HYCLATE 100 MG PO CAPS: 100.0000 mg | ORAL_CAPSULE | Freq: Two times a day (BID) | ORAL | 0 refills | Status: DC

## 2020-03-03 NOTE — ED Provider Notes (Signed)
[facility] DEPT Provider Note   CSN: [id number] Arrival date & time: 03/03/20  6599     History Chief Complaint  Patient presents with  . Urinary Frequency    Marvin Lam is a 49 y.o. male.  HPI Patient is noticing some burning with urination.  This has been going on for several days.  He is also observing some clear drainage from the penis.  He denies any pain in the testicles.  No swelling of the testicles.  No rashes.  No abdominal pain or flank pain.  No nausea vomiting or diarrhea.  Patient does not think he has possibility of STD.  He is sexually active with one male partner.    Past Medical History:  Diagnosis Date  . Glaucoma   . UTI (lower urinary tract infection)     Patient Active Problem List   Diagnosis Date Noted  . Globus sensation 02/11/2019  . DYSLIPIDEMIA 03/09/2007  . GLAUCOMA NOS 03/09/2007  . DYSURIA 03/09/2007    Past Surgical History:  Procedure Laterality Date  . EYE SURGERY    . LASIK         Family History  Problem Relation Age of Onset  . Diabetes Mother   . Kidney disease Mother   . Glaucoma Father   . GER disease Father     Social History   Tobacco Use  . Smoking status: Former Research scientist (life sciences)  . Smokeless tobacco: Never Used  Vaping Use  . Vaping Use: Never used  Substance Use Topics  . Alcohol use: No  . Drug use: No    Home Medications Prior to Admission medications   Medication Sig Start Date End Date Taking? Authorizing Provider  amoxicillin (AMOXIL) 500 MG capsule Take 1 capsule (500 mg total) by mouth 3 (three) times daily. Patient not taking: Reported on 04/06/2019 02/05/19   Hayden Rasmussen, MD  cephALEXin (KEFLEX) 500 MG capsule Take 1 capsule (500 mg total) by mouth 3 (three) times daily. 04/27/19   Varney Biles, MD  doxycycline (VIBRAMYCIN) 100 MG capsule Take 1 capsule (100 mg total) by mouth 2 (two) times daily. 04/27/19   Varney Biles, MD  doxycycline (VIBRAMYCIN) 100 MG  capsule Take 1 capsule (100 mg total) by mouth 2 (two) times daily. One po bid x 7 days 03/03/20   Charlesetta Shanks, MD  fluticasone [facility]) 50 MCG/ACT nasal spray Place 1 spray into both nostrils daily. Patient not taking: Reported on 04/06/2019 02/05/19   Hayden Rasmussen, MD  guaiFENesin-codeine 100-10 MG/5ML syrup Take 10 mLs by mouth every 6 (six) hours as needed for cough. Patient not taking: Reported on 04/06/2019 09/06/15   Junius Creamer, NP  latanoprost (XALATAN) 0.005 % ophthalmic solution Place 1 drop into both eyes at bedtime. 01/10/20   [provider]  levocetirizine (XYZAL) 5 MG tablet Take 1 tablet (5 mg total) by mouth every evening. Patient not taking: Reported on 04/06/2019 02/28/16   Street, [place], PA-C  pantoprazole (PROTONIX) 40 MG tablet TAKE 1 TABLET BY MOUTH EVERY DAY Patient not taking: Reported on 04/06/2019 03/06/19   Luetta Nutting, DO    Allergies    Patient has no known allergies.  Review of Systems   Review of Systems Constitutional: No fever no chills no malaise Respiratory: No cough no shortness of breath GI: No vomiting or diarrhea Physical Exam Updated Vital Signs BP 114/82 (BP Location: Left Arm)   Pulse 80   Temp 98.1 F (36.7 C) (Oral)  Resp 16   SpO2 99%   Physical Exam Constitutional:      Comments: Alert nontoxic well in appearance.  Eyes:     Extraocular Movements: Extraocular movements intact.  Cardiovascular:     Rate and Rhythm: Normal rate and regular rhythm.  Pulmonary:     Effort: Pulmonary effort is normal.     Breath sounds: Normal breath sounds.  Abdominal:     General: There is no distension.     Palpations: Abdomen is soft.     Tenderness: There is no abdominal tenderness. There is no guarding.     Comments: No CVA tenderness.  Musculoskeletal:        General: Normal range of motion.  Skin:    General: Skin is warm and dry.  Neurological:     General: No focal deficit present.  Psychiatric:        Mood and Affect:  Mood normal.     ED Results / Procedures / Treatments   Labs (all labs ordered are listed, but only abnormal results are displayed) Labs Reviewed  URINALYSIS, ROUTINE W REFLEX MICROSCOPIC - Abnormal; Notable for the following components:      Result Value   Leukocytes,Ua TRACE (*)    All other components within normal limits  URINE CULTURE  GC/CHLAMYDIA PROBE AMP ([employer]) NOT AT [facility]    EKG None  Radiology No results found.  Procedures Procedures (including critical care time)  Medications Ordered in ED Medications  cefTRIAXone (ROCEPHIN) injection 500 mg (has no administration in time range)  doxycycline (VIBRA-TABS) tablet 100 mg (has no administration in time range)    ED Course  I have reviewed the triage vital signs and the nursing notes.  Pertinent labs & imaging results that were available during my care of the patient were reviewed by me and considered in my medical decision making (see chart for details).    MDM Rules/Calculators/A&P                          Patient presents as outlined.  He is otherwise clinically well.  He has had some dysuria and clear drainage from the penis.  No flank pain, fever or abdominal pain.  At this time, will opt to cover for possible STI urethritis.  Patient has had positive chlamydia testing in the past.  We reviewed the plan to treat empirically for possible STI with Rocephin and doxycycline.  Patient is aware diagnostic results will take several days.  Patient is counseled on follow-up and recheck.  He has had several presentations over the past year for dysuria.  He is advised that if his symptoms are not due to a sexually transmitted disease, he should have follow-up and further evaluation.  Patient is also counseled on the recommendation to obtain other testing such as HIV and RPR but declines at this time. Final Clinical Impression(s) / ED Diagnoses Final diagnoses:  Dysuria    Rx / DC Orders ED Discharge Orders          Ordered    doxycycline (VIBRAMYCIN) 100 MG capsule  2 times daily     Discontinue  Reprint     03/03/20 1238           Charlesetta Shanks, MD 03/03/20 1248

## 2020-03-03 NOTE — Discharge Instructions (Signed)
1.  You have been given a shot of Rocephin in the emergency department.  This is to treat for pain with urination and discharge from the penis.  You have also been given a prescription for doxycycline.  Complete this prescription. 2.  No sexual activity until you have completed your treatment.  At this time, there are gonorrhea and Chlamydia testing done with those results will not be available for several days.  A sexually transmitted disease can be passed back and forth between partners.  Your partner should be treated as well because sometimes chlamydia is present without any noticeable symptoms. 3.  You have had several episodes of urinary symptoms.  If this persists and there is no sexually transmitted disease present, you should be seen by a urologist for further evaluation of other causes for urinary problems in men.  Contact information for [facility] is included in your discharge instructions. 4.  You should have a family physician to help manage your medical problems and make referrals for you.  The resource guide is included to help you find low cost medical care. 5.  You have declined testing for HIV and syphilis at this time.  If one sexually transmitted disease is present, you should have testing for others.

## 2020-03-03 NOTE — ED Triage Notes (Signed)
Patient reports urinary frequency and burning with urination. Patient denies chance of STI. Patient reports clear d/c from penis

## 2020-03-05 LAB — URINE CULTURE: Culture: NO GROWTH

## 2020-08-21 ENCOUNTER — Encounter (HOSPITAL_COMMUNITY): Payer: Self-pay

## 2020-08-21 ENCOUNTER — Other Ambulatory Visit: Payer: Self-pay

## 2020-08-21 ENCOUNTER — Emergency Department (HOSPITAL_COMMUNITY)
Admission: EM | Admit: 2020-08-21 | Discharge: 2020-08-21 | Disposition: A | Discharge status: Home / Self Care | Payer: 59 | Attending: Emergency Medicine | Admitting: Emergency Medicine

## 2020-08-21 DIAGNOSIS — J988 Other specified respiratory disorders: Secondary | ICD-10-CM

## 2020-08-21 DIAGNOSIS — Z20822 Contact with and (suspected) exposure to covid-19: Secondary | ICD-10-CM

## 2020-08-21 DIAGNOSIS — U071 COVID-19: Secondary | ICD-10-CM | POA: Insufficient documentation

## 2020-08-21 DIAGNOSIS — R059 Cough, unspecified: Secondary | ICD-10-CM

## 2020-08-21 DIAGNOSIS — Z87891 Personal history of nicotine dependence: Secondary | ICD-10-CM | POA: Diagnosis not present

## 2020-08-21 MED ORDER — BENZONATATE 100 MG PO CAPS
100.0000 mg | ORAL_CAPSULE | Freq: Once | ORAL | Status: AC
  Administered 2020-08-21: 100 mg via ORAL
  Filled 2020-08-21: qty 1

## 2020-08-21 MED ORDER — BENZONATATE 100 MG PO CAPS: 100.0000 mg | ORAL_CAPSULE | Freq: Three times a day (TID) | ORAL | 0 refills | Status: DC

## 2020-08-21 MED ORDER — HYDROCOD POLST-CPM POLST ER 10-8 MG/5ML PO SUER: 5.0000 mL | Freq: Two times a day (BID) | ORAL | 0 refills | Status: DC | PRN

## 2020-08-21 NOTE — ED Triage Notes (Signed)
Pt reports cough and chills x1 week. Pt denies being vaccinated for COVID.

## 2020-08-21 NOTE — ED Provider Notes (Signed)
[facility] DEPT Provider Note   CSN: [id number] Arrival date & time: 08/21/20  1038     History Chief Complaint  Patient presents with  . Cough  . Chills    Marvin Lam is a 50 y.o. male.  Patient with no significant past medical history presents the emergency department for cough, chills and low-grade fever.  Patient is on day 4 of illness.  No loss of taste or smell.  Mild sore throat, body aches, headache.  Patient has been using multiple over-the-counter medications for cough.  He states that he is having significant difficulty sleeping due to cough.  He is not vaccinated against COVID.  He has had some watery diarrhea, no nausea or vomiting.  No abdominal pain or urinary symptoms.        Past Medical History:  Diagnosis Date  . Glaucoma   . UTI (lower urinary tract infection)     Patient Active Problem List   Diagnosis Date Noted  . Globus sensation 02/11/2019  . DYSLIPIDEMIA 03/09/2007  . GLAUCOMA NOS 03/09/2007  . DYSURIA 03/09/2007    Past Surgical History:  Procedure Laterality Date  . EYE SURGERY    . LASIK         Family History  Problem Relation Age of Onset  . Diabetes Mother   . Kidney disease Mother   . Glaucoma Father   . GER disease Father     Social History   Tobacco Use  . Smoking status: Former Research scientist (life sciences)  . Smokeless tobacco: Never Used  Vaping Use  . Vaping Use: Never used  Substance Use Topics  . Alcohol use: No  . Drug use: No    Home Medications Prior to Admission medications   Medication Sig Start Date End Date Taking? Authorizing Provider  benzonatate (TESSALON) 100 MG capsule Take 1 capsule (100 mg total) by mouth every 8 (eight) hours. 08/21/20  Yes Carlisle Cater, PA-C  chlorpheniramine-HYDROcodone (TUSSIONEX PENNKINETIC ER) 10-8 MG/5ML SUER Take 5 mLs by mouth every 12 (twelve) hours as needed for cough. 08/21/20  Yes Carlisle Cater, PA-C  cephALEXin (KEFLEX) 500 MG capsule Take 1  capsule (500 mg total) by mouth 3 (three) times daily. 04/27/19   Varney Biles, MD  doxycycline (VIBRAMYCIN) 100 MG capsule Take 1 capsule (100 mg total) by mouth 2 (two) times daily. 04/27/19   Varney Biles, MD  doxycycline (VIBRAMYCIN) 100 MG capsule Take 1 capsule (100 mg total) by mouth 2 (two) times daily. One po bid x 7 days 03/03/20   Charlesetta Shanks, MD  latanoprost (XALATAN) 0.005 % ophthalmic solution Place 1 drop into both eyes at bedtime. 01/10/20   [provider]  fluticasone (FLONASE) 50 MCG/ACT nasal spray Place 1 spray into both nostrils daily. Patient not taking: Reported on 04/06/2019 02/05/19 08/21/20  Hayden Rasmussen, MD  levocetirizine (XYZAL) 5 MG tablet Take 1 tablet (5 mg total) by mouth every evening. Patient not taking: Reported on 04/06/2019 02/28/16 08/21/20  Street, [place], PA-C  pantoprazole (PROTONIX) 40 MG tablet TAKE 1 TABLET BY MOUTH EVERY DAY Patient not taking: Reported on 04/06/2019 03/06/19 08/21/20  Luetta Nutting, DO    Allergies    Patient has no known allergies.  Review of Systems   Review of Systems  Constitutional: Positive for chills, fatigue and fever.  HENT: Negative for congestion, ear pain, rhinorrhea, sinus pressure and sore throat.   Eyes: Negative for redness.  Respiratory: Positive for cough. Negative for shortness of breath and  wheezing.   Gastrointestinal: Positive for diarrhea. Negative for abdominal pain, nausea and vomiting.  Genitourinary: Negative for dysuria.  Musculoskeletal: Positive for myalgias. Negative for neck stiffness.  Skin: Negative for rash.  Neurological: Positive for headaches.  Hematological: Negative for adenopathy.    Physical Exam Updated Vital Signs BP (!) 143/85 (BP Location: Left Arm)   Pulse (!) 101   Temp (!) 100.4 F (38 C) (Oral)   Resp 20   SpO2 97%   Physical Exam Vitals and nursing note reviewed.  Constitutional:      Appearance: He is well-developed and well-nourished.  HENT:      Head: Normocephalic and atraumatic.     Jaw: No trismus.     Right Ear: Tympanic membrane, ear canal and external ear normal.     Left Ear: Tympanic membrane, ear canal and external ear normal.     Nose: Nose normal. No mucosal edema or rhinorrhea.     Mouth/Throat:     Mouth: Oropharynx is clear and moist and mucous membranes are normal. Mucous membranes are not dry.     Pharynx: Uvula midline. No oropharyngeal exudate, posterior oropharyngeal edema, posterior oropharyngeal erythema or uvula swelling.     Tonsils: No tonsillar abscesses.  Eyes:     General:        Right eye: No discharge.        Left eye: No discharge.     Conjunctiva/sclera: Conjunctivae normal.  Cardiovascular:     Rate and Rhythm: Normal rate and regular rhythm.     Heart sounds: Normal heart sounds.  Pulmonary:     Effort: Pulmonary effort is normal. No respiratory distress.     Breath sounds: Normal breath sounds. No wheezing or rales.     Comments: Frequent coughing during exam.  Lungs are clear to auscultation bilaterally. Abdominal:     Palpations: Abdomen is soft.     Tenderness: There is no abdominal tenderness.  Musculoskeletal:     Cervical back: Normal range of motion and neck supple.  Skin:    General: Skin is warm and dry.  Neurological:     Mental Status: He is alert.  Psychiatric:        Mood and Affect: Mood and affect normal.     ED Results / Procedures / Treatments   Labs (all labs ordered are listed, but only abnormal results are displayed) Labs Reviewed  SARS CORONAVIRUS 2 (TAT 6-24 HRS)    EKG None  Radiology No results found.  Procedures Procedures (including critical care time)  Medications Ordered in ED Medications  benzonatate (TESSALON) capsule 100 mg (has no administration in time range)    ED Course  I have reviewed the triage vital signs and the nursing notes.  Pertinent labs & imaging results that were available during my care of the patient were reviewed by  me and considered in my medical decision making (see chart for details).  Patient seen and examined.  We will send COVID-19 testing.  Patient be discharged home with antitussive medications.  Vital signs reviewed and are as follows: BP (!) 143/85 (BP Location: Left Arm)   Pulse (!) 101   Temp (!) 100.4 F (38 C) (Oral)   Resp 20   SpO2 97%   6:39 PM Patient counseled on use of narcotic cough medications. Counseled not to combine these medications with others containing tylenol. Urged not to drink alcohol, drive, or perform any other activities that requires focus while taking these medications. The patient  verbalizes understanding and agrees with the plan.  Detailed discussion had with with patient regarding COVID-19 precautions and written instructions given as well.  We discussed need to isolate themselves for 5 days from onset of symptoms and have 24 hours of improvement prior to breaking isolation.  We discussed that when breaking isolation, mask wearing for 5 additional days is required.  We discussed signs symptoms to return which include worsening shortness of breath, trouble breathing, or increased work of breathing.  Also return with persistent vomiting, confusion, passing out, or if they have any other concerns. Counseled on the need for rest and good hydration. Discussed that high-risk contacts should be aware of positive result and they need to quarantine and be tested if they develop any symptoms. Patient verbalizes understanding.   Marvin Lam was evaluated in Emergency Department on 08/21/2020 for the symptoms described in the history of present illness. He was evaluated in the context of the global COVID-19 pandemic, which necessitated consideration that the patient might be at risk for infection with the SARS-CoV-2 virus that causes COVID-19. Institutional protocols and algorithms that pertain to the evaluation of patients at risk for COVID-19 are in a state of rapid change based  on information released by regulatory bodies including the CDC and federal and state organizations. These policies and algorithms were followed during the patient's care in the ED.      MDM Rules/Calculators/A&P                          Patient with COVID-like symptoms, high suspicion for COVID-19.  Patient's main issue currently is bothersome cough.  Lung sounds are clear and he is not hypoxic.  No indications for admission at this point.  Low suspicion for pneumonia at this time.  Will be prescribed Tessalon and Tussionex for bothersome cough.  Patient has been having a lot of difficulty sleeping.  We discussed precautions for narcotic cough medications.   Final Clinical Impression(s) / ED Diagnoses Final diagnoses:  Cough  Respiratory infection  Encounter for laboratory testing for COVID-19 virus    Rx / DC Orders ED Discharge Orders         Ordered    benzonatate (TESSALON) 100 MG capsule  Every 8 hours        08/21/20 1832    chlorpheniramine-HYDROcodone (TUSSIONEX PENNKINETIC ER) 10-8 MG/5ML SUER  Every 12 hours PRN        08/21/20 1832           Carlisle Cater, PA-C 08/21/20 1846    Milton Ferguson, MD 08/21/20 2221

## 2020-08-21 NOTE — Discharge Instructions (Signed)
Please read and follow all provided instructions.  Your diagnoses today include:  1. Cough   2. Respiratory infection   3. Encounter for laboratory testing for COVID-19 virus     Tests performed today include:  Vital signs. See below for your results today.   COVID test - pending, check mychart for results  Medications prescribed:   Tessalon Perles - cough suppressant medication   Tussinex - narcotic cough suppressant syrup  You have been prescribed narcotic cough suppressant such as Tussinex: DO NOT drive or perform any activities that require you to be awake and alert because this medicine can make you drowsy.    Take any prescribed medications only as directed. Treatment for your infection is aimed at treating the symptoms. There are no medications, such as antibiotics, that will cure your infection.   Home care instructions:  Follow any educational materials contained in this packet.   Your illness is contagious and can be spread to others, especially during the first 3 or 4 days. It cannot be cured by antibiotics or other medicines. Take basic precautions such as washing your hands often, covering your mouth when you cough or sneeze, and avoiding public places where you could spread your illness to others.   Please continue drinking plenty of fluids.  Use over-the-counter medicines as needed as directed on packaging for symptom relief.  You may also use ibuprofen or tylenol as directed on packaging for pain or fever.  Do not take multiple medicines containing Tylenol or acetaminophen to avoid taking too much of this medication.  If you are positive for Covid-19, you should isolate yourself and not be exposed to other people for 5 days after your symptoms began. If you are not feeling better at day 5, you need to isolate yourself for a total of 10 days. If you are feeling better by day 5, you should wear a mask properly, over your nose and mouth, at all times while around other  people until 10 days after your symptoms started.   Follow-up instructions: Please follow-up with your primary care provider as needed for further evaluation of your symptoms if you are not feeling better.   Return instructions:   Please return to the Emergency Department if you experience worsening symptoms.   Return to the emergency department if you have worsening shortness of breath breathing or increased work of breathing, persistent vomiting  RETURN IMMEDIATELY IF you develop shortness of breath, confusion or altered mental status, a new rash, become dizzy, faint, or poorly responsive, or are unable to be cared for at home.  Please return if you have persistent vomiting and cannot keep down fluids or develop a fever that is not controlled by tylenol or motrin.    Please return if you have any other emergent concerns.  Additional Information:  Your vital signs today were: BP (!) 143/85 (BP Location: Left Arm)   Pulse (!) 101   Temp (!) 100.4 F (38 C) (Oral)   Resp 20   SpO2 97%  If your blood pressure (BP) was elevated above 135/85 this visit, please have this repeated by your doctor within one month. --------------

## 2020-08-22 LAB — SARS CORONAVIRUS 2 (TAT 6-24 HRS): SARS Coronavirus 2: POSITIVE — AB

## 2020-08-23 ENCOUNTER — Telehealth: Payer: Self-pay

## 2020-08-23 NOTE — Telephone Encounter (Signed)
Called to discuss with patient about COVID-19 symptoms and the use of one of the available treatments for those with mild to moderate Covid symptoms and at a high risk of hospitalization.  Pt appears to qualify for outpatient treatment due to co-morbid conditions and/or a member of an at-risk group in accordance with the FDA Emergency Use Authorization.    Symptom onset: 08/17/20 per chart Vaccinated: Unknown Booster? Unknown Immunocompromised? No Qualifiers: None  Unable to reach pt - No answer.  Marcello Moores

## 2020-08-24 ENCOUNTER — Telehealth (INDEPENDENT_AMBULATORY_CARE_PROVIDER_SITE_OTHER): Payer: 59 | Admitting: Family Medicine

## 2020-08-24 ENCOUNTER — Encounter: Payer: Self-pay | Admitting: Family Medicine

## 2020-08-24 DIAGNOSIS — U071 COVID-19: Secondary | ICD-10-CM | POA: Diagnosis not present

## 2020-08-24 MED ORDER — BENZONATATE 200 MG PO CAPS: 200.0000 mg | ORAL_CAPSULE | Freq: Three times a day (TID) | ORAL | 0 refills | Status: DC | PRN

## 2020-08-24 NOTE — Progress Notes (Signed)
Marvin Lam - 50 y.o. male MRN [id number]  Date of birth: 11-27-1970   This visit type was conducted due to national recommendations for restrictions regarding the COVID-19 Pandemic (e.g. social distancing).  This format is felt to be most appropriate for this patient at this time.  All issues noted in this document were discussed and addressed.  No physical exam was performed (except for noted visual exam findings with Video Visits).  I discussed the limitations of evaluation and management by telemedicine and the availability of in person appointments. The patient expressed understanding and agreed to proceed.  I connected with@ on 08/24/20 at 11:30 AM EST by a video enabled telemedicine application and verified that I am speaking with the correct person using two identifiers.  Present at visit: Luetta Nutting, DO [place]   Patient Location: Home  [place] [id number]   Provider location:   [facility]  Chief Complaint  Patient presents with  . Cough    HPI  Marvin Lam is a 50 y.o. male who presents via audio/video conferencing for a telehealth visit today.  Patient recently tested positive for COVID-19 on 1/22.  Symptom onset 08/18/19.  He is doing ok for the most part other than cough which is bothersome.  He was seen at ED on 1/22 and prescribed tussionex.  He does get some relief with this for a few hours. He has not had fevers for a few days.  He denies dyspnea or difficulty breathing.  He is eating and drinking normally.    ROS:  A comprehensive ROS was completed and negative except as noted per HPI  Past Medical History:  Diagnosis Date  . Glaucoma   . UTI (lower urinary tract infection)     Past Surgical History:  Procedure Laterality Date  . EYE SURGERY    . LASIK      Family History  Problem Relation Age of Onset  . Diabetes Mother   . Kidney disease Mother   . Glaucoma Father   . GER disease Father     Social History    Socioeconomic History  . Marital status: Single    Spouse name: Not on file  . Number of children: Not on file  . Years of education: Not on file  . Highest education level: Not on file  Occupational History  . Not on file  Tobacco Use  . Smoking status: Former Research scientist (life sciences)  . Smokeless tobacco: Never Used  Vaping Use  . Vaping Use: Never used  Substance and Sexual Activity  . Alcohol use: No  . Drug use: No  . Sexual activity: Yes    Partners: Female    Birth control/protection: Condom  Other Topics Concern  . Not on file  Social History Narrative  . Not on file   Social Determinants of Health   Financial Resource Strain: Not on file  Food Insecurity: Not on file  Transportation Needs: Not on file  Physical Activity: Not on file  Stress: Not on file  Social Connections: Not on file  Intimate Partner Violence: Not on file     Current Outpatient Medications:  .  benzonatate (TESSALON) 200 MG capsule, Take 1 capsule (200 mg total) by mouth 3 (three) times daily as needed for cough., Disp: 45 capsule, Rfl: 0 .  cephALEXin (KEFLEX) 500 MG capsule, Take 1 capsule (500 mg total) by mouth 3 (three) times daily., Disp: 7 capsule, Rfl: 0 .  chlorpheniramine-HYDROcodone (TUSSIONEX  PENNKINETIC ER) 10-8 MG/5ML SUER, Take 5 mLs by mouth every 12 (twelve) hours as needed for cough., Disp: 140 mL, Rfl: 0 .  doxycycline (VIBRAMYCIN) 100 MG capsule, Take 1 capsule (100 mg total) by mouth 2 (two) times daily., Disp: 14 capsule, Rfl: 0 .  doxycycline (VIBRAMYCIN) 100 MG capsule, Take 1 capsule (100 mg total) by mouth 2 (two) times daily. One po bid x 7 days, Disp: 14 capsule, Rfl: 0 .  latanoprost (XALATAN) 0.005 % ophthalmic solution, Place 1 drop into both eyes at bedtime., Disp: , Rfl:   EXAM:  VITALS per patient if applicable: Wt 210 lb (95.3 kg)   BMI 31.01 kg/m   GENERAL: alert, oriented, appears well and in no acute distress  HEENT: atraumatic, conjunttiva clear, no obvious  abnormalities on inspection of external nose and ears  NECK: normal movements of the head and neck  LUNGS: on inspection no signs of respiratory distress, breathing rate appears normal, no obvious gross SOB, gasping or wheezing  CV: no obvious cyanosis  MS: moves all visible extremities without noticeable abnormality  PSYCH/NEURO: pleasant and cooperative, no obvious depression or anxiety, speech and thought processing grossly intact  ASSESSMENT AND PLAN:  Discussed the following assessment and plan:  COVID-19 Symptoms pretty mild at this point, still with lingering cough.  Recommend that he continue to increase fluid intake.  Continue tussionex prn.  Adding tessalon perles.  Red flags discussed and recommend that he seek emergency care for increased difficulty breathing, severe fatigue or difficulty remaining hydrated.        I discussed the assessment and treatment plan with the patient. The patient was provided an opportunity to ask questions and all were answered. The patient agreed with the plan and demonstrated an understanding of the instructions.   The patient was advised to call back or seek an in-person evaluation if the symptoms worsen or if the condition fails to improve as anticipated.    Luetta Nutting, DO

## 2020-08-24 NOTE — Progress Notes (Signed)
COVID +, Cough since 08/18/19.  Using cough drops.  Setup MyChart with patient.

## 2020-08-24 NOTE — Assessment & Plan Note (Signed)
Symptoms pretty mild at this point, still with lingering cough.  Recommend that he continue to increase fluid intake.  Continue tussionex prn.  Adding tessalon perles.  Red flags discussed and recommend that he seek emergency care for increased difficulty breathing, severe fatigue or difficulty remaining hydrated.

## 2020-09-15 ENCOUNTER — Other Ambulatory Visit: Payer: Self-pay | Admitting: Family Medicine

## 2020-09-15 ENCOUNTER — Encounter: Payer: Self-pay | Admitting: Family Medicine

## 2020-09-15 ENCOUNTER — Ambulatory Visit (INDEPENDENT_AMBULATORY_CARE_PROVIDER_SITE_OTHER): Payer: 59 | Admitting: Family Medicine

## 2020-09-15 ENCOUNTER — Other Ambulatory Visit: Payer: Self-pay

## 2020-09-15 VITALS — BP 122/69 | HR 71 | Temp 98.7°F | Wt 211.7 lb

## 2020-09-15 DIAGNOSIS — R1032 Left lower quadrant pain: Secondary | ICD-10-CM | POA: Diagnosis not present

## 2020-09-15 NOTE — Assessment & Plan Note (Signed)
LLQ abdominal pain concerning for diverticulitis.  CT abdomen/pelvis ordered. CMP and CBC w/ diff ordered.   If this confirms diverticulitis will start antibiotics.  He has never had colon cancer screening either, we'll need to get him set up for this in the future as well.

## 2020-09-15 NOTE — Patient Instructions (Signed)
Have labs completed.  We'll be in touch with results of CT and labs

## 2020-09-15 NOTE — Progress Notes (Signed)
Marvin Lam - 50 y.o. male MRN [id number]  Date of birth: [birth year]  Subjective Chief Complaint  Patient presents with  . Abdominal Pain    HPI Marvin Lam is a 50 y.o. male here today with complaint of LLQ pain.  Describes as pressure and discomfort, feels swollen.  Symptoms started 1 week ago.  He has more gas and more frequent bowel movements.  He denies diarrhea, fever, chills, nausea or vomiting. No urinary symptoms.   Had similar episode about 1 year ago that improved with dietary changes.  He has tried same approach this time but symptom have not improved.  He has never had colon cancer screening.  No family history of colon cancer.   ROS:  A comprehensive ROS was completed and negative except as noted per HPI  No Known Allergies  Past Medical History:  Diagnosis Date  . Glaucoma   . UTI (lower urinary tract infection)     Past Surgical History:  Procedure Laterality Date  . EYE SURGERY    . LASIK      Social History   Socioeconomic History  . Marital status: Single    Spouse name: Not on file  . Number of children: Not on file  . Years of education: Not on file  . Highest education level: Not on file  Occupational History  . Not on file  Tobacco Use  . Smoking status: Former Research scientist (life sciences)  . Smokeless tobacco: Never Used  Vaping Use  . Vaping Use: Never used  Substance and Sexual Activity  . Alcohol use: No  . Drug use: No  . Sexual activity: Yes    Partners: Female    Birth control/protection: Condom  Other Topics Concern  . Not on file  Social History Narrative  . Not on file   Social Determinants of Health   Financial Resource Strain: Not on file  Food Insecurity: Not on file  Transportation Needs: Not on file  Physical Activity: Not on file  Stress: Not on file  Social Connections: Not on file    Family History  Problem Relation Age of Onset  . Diabetes Mother   . Kidney disease Mother   . Glaucoma Father   . GER disease Father      Health Maintenance  Topic Date Due  . Hepatitis C Screening  Never done  . COVID-19 Vaccine (1) Never done  . HIV Screening  Never done  . COLONOSCOPY (Pts 45-13yrs Insurance coverage will need to be confirmed)  Never done  . TETANUS/TDAP  02/29/2016  . INFLUENZA VACCINE  Never done     ----------------------------------------------------------------------------------------------------------------------------------------------------------------------------------------------------------------- Physical Exam BP 122/69 (BP Location: Left Arm, Patient Position: Sitting, Cuff Size: Normal)   Pulse 71   Temp 98.7 F (37.1 C) (Oral)   Wt 211 lb 11.2 oz (96 kg)   SpO2 99%   BMI 31.26 kg/m   Physical Exam Constitutional:      Appearance: He is well-developed.  Eyes:     General: No scleral icterus. Cardiovascular:     Rate and Rhythm: Normal rate and regular rhythm.  Pulmonary:     Effort: Pulmonary effort is normal.     Breath sounds: Normal breath sounds.  Abdominal:     General: Bowel sounds are normal.     Tenderness: There is abdominal tenderness (LLQ ttp, worse with deep palpation. ). There is no guarding.  Musculoskeletal:     Cervical back: Neck supple.  Neurological:     General: No  focal deficit present.     Mental Status: He is alert.  Psychiatric:        Mood and Affect: Mood normal.        Behavior: Behavior normal.     ------------------------------------------------------------------------------------------------------------------------------------------------------------------------------------------------------------------- Assessment and Plan  Left lower quadrant abdominal pain LLQ abdominal pain concerning for diverticulitis.  CT abdomen/pelvis ordered. CMP and CBC w/ diff ordered.   If this confirms diverticulitis will start antibiotics.  He has never had colon cancer screening either, we'll need to get him set up for this in the future as well.      No orders of the defined types were placed in this encounter.   No follow-ups on file.    This visit occurred during the SARS-CoV-2 public health emergency.  Safety protocols were in place, including screening questions prior to the visit, additional usage of staff PPE, and extensive cleaning of exam room while observing appropriate contact time as indicated for disinfecting solutions.

## 2020-09-16 ENCOUNTER — Ambulatory Visit (INDEPENDENT_AMBULATORY_CARE_PROVIDER_SITE_OTHER): Payer: 59

## 2020-09-16 ENCOUNTER — Other Ambulatory Visit: Payer: Self-pay | Admitting: Family Medicine

## 2020-09-16 DIAGNOSIS — R1032 Left lower quadrant pain: Secondary | ICD-10-CM

## 2020-09-16 DIAGNOSIS — Z1211 Encounter for screening for malignant neoplasm of colon: Secondary | ICD-10-CM

## 2020-09-16 LAB — COMPLETE METABOLIC PANEL WITH GFR
AG Ratio: 1.4 (calc) (ref 1.0–2.5)
ALT: 38 U/L (ref 9–46)
AST: 18 U/L (ref 10–35)
Albumin: 4 g/dL (ref 3.6–5.1)
Alkaline phosphatase (APISO): 38 U/L (ref 35–144)
BUN: 12 mg/dL (ref 7–25)
CO2: 24 mmol/L (ref 20–32)
Calcium: 9.7 mg/dL (ref 8.6–10.3)
Chloride: 106 mmol/L (ref 98–110)
Creat: 0.84 mg/dL (ref 0.70–1.33)
GFR, Est African American: 118 mL/min/{1.73_m2} (ref >=60)
GFR, Est Non African American: 102 mL/min/{1.73_m2} (ref >=60)
Globulin: 2.8 g/dL (calc) (ref 1.9–3.7)
Glucose, Bld: 78 mg/dL (ref 65–99)
Potassium: 4.4 mmol/L (ref 3.5–5.3)
Sodium: 139 mmol/L (ref 135–146)
Total Bilirubin: 0.4 mg/dL (ref 0.2–1.2)
Total Protein: 6.8 g/dL (ref 6.1–8.1)

## 2020-09-16 LAB — CBC WITH DIFFERENTIAL/PLATELET
Absolute Monocytes: 464 cells/uL (ref 200–950)
Basophils Absolute: 30 cells/uL (ref 0–200)
Basophils Relative: 0.7 %
Eosinophils Absolute: 133 cells/uL (ref 15–500)
Eosinophils Relative: 3.1 %
HCT: 41.8 % (ref 38.5–50.0)
Hemoglobin: 14.2 g/dL (ref 13.2–17.1)
Lymphs Abs: 2382 cells/uL (ref 850–3900)
MCH: 31 pg (ref 27.0–33.0)
MCHC: 34 g/dL (ref 32.0–36.0)
MCV: 91.3 fL (ref 80.0–100.0)
MPV: 10.1 fL (ref 7.5–12.5)
Monocytes Relative: 10.8 %
Neutro Abs: 1290 cells/uL — ABNORMAL LOW (ref 1500–7800)
Neutrophils Relative %: 30 %
Platelets: 300 10*3/uL (ref 140–400)
RBC: 4.58 10*6/uL (ref 4.20–5.80)
RDW: 13.1 % (ref 11.0–15.0)
Total Lymphocyte: 55.4 %
WBC: 4.3 10*3/uL (ref 3.8–10.8)

## 2020-09-16 MED ORDER — IOHEXOL 300 MG/ML  SOLN
100.0000 mL | Freq: Once | INTRAMUSCULAR | Status: AC | PRN
  Administered 2020-09-16: 100 mL via INTRAVENOUS

## 2020-09-27 ENCOUNTER — Encounter: Payer: Self-pay | Admitting: Gastroenterology

## 2020-11-18 ENCOUNTER — Other Ambulatory Visit: Payer: Self-pay

## 2020-11-18 ENCOUNTER — Ambulatory Visit (AMBULATORY_SURGERY_CENTER): Payer: Self-pay

## 2020-11-18 VITALS — Ht 69.0 in | Wt 213.0 lb

## 2020-11-18 DIAGNOSIS — Z1211 Encounter for screening for malignant neoplasm of colon: Secondary | ICD-10-CM

## 2020-11-18 MED ORDER — SUTAB 1479-225-188 MG PO TABS: 12.0000 | ORAL_TABLET | ORAL | 0 refills | Status: DC

## 2020-11-18 NOTE — Progress Notes (Signed)
No allergies to soy or egg Pt is not on blood thinners or diet pills Issues with sedation/intubation limited to assess- pt has been sedated without problems but not intubated Denies atrial flutter/fib Denies constipation   Emmi instructions given to pt  Pt is aware of Covid safety and care partner requirements.   Pt instructed to stop metamucil x 5 days prior to procedure.  States he goes to bathroom daily

## 2020-11-30 ENCOUNTER — Other Ambulatory Visit: Payer: Self-pay

## 2020-11-30 ENCOUNTER — Encounter: Payer: Self-pay | Admitting: Gastroenterology

## 2020-11-30 ENCOUNTER — Ambulatory Visit (AMBULATORY_SURGERY_CENTER): Payer: 59 | Admitting: Gastroenterology

## 2020-11-30 VITALS — BP 103/64 | HR 64 | Temp 96.9°F | Resp 16 | Ht 69.0 in | Wt 213.0 lb

## 2020-11-30 DIAGNOSIS — Z1211 Encounter for screening for malignant neoplasm of colon: Secondary | ICD-10-CM

## 2020-11-30 DIAGNOSIS — D128 Benign neoplasm of rectum: Secondary | ICD-10-CM | POA: Diagnosis not present

## 2020-11-30 DIAGNOSIS — R109 Unspecified abdominal pain: Secondary | ICD-10-CM

## 2020-11-30 DIAGNOSIS — C7A026 Malignant carcinoid tumor of the rectum: Secondary | ICD-10-CM

## 2020-11-30 DIAGNOSIS — D127 Benign neoplasm of rectosigmoid junction: Secondary | ICD-10-CM

## 2020-11-30 DIAGNOSIS — D125 Benign neoplasm of sigmoid colon: Secondary | ICD-10-CM | POA: Diagnosis not present

## 2020-11-30 DIAGNOSIS — D3A8 Other benign neuroendocrine tumors: Secondary | ICD-10-CM

## 2020-11-30 MED ORDER — DICYCLOMINE HCL 10 MG PO CAPS: 10.0000 mg | ORAL_CAPSULE | Freq: Three times a day (TID) | ORAL | 1 refills | Status: DC

## 2020-11-30 MED ORDER — SODIUM CHLORIDE 0.9 % IV SOLN: 500.0000 mL | INTRAVENOUS | Status: DC

## 2020-11-30 NOTE — Progress Notes (Signed)
Called to room to assist during endoscopic procedure.  Patient ID and intended procedure confirmed with present staff. Received instructions for my participation in the procedure from the performing physician.  

## 2020-11-30 NOTE — Patient Instructions (Addendum)
Thank you for allowing [place] to care for you today!  Await final biopsy results by mail, approximately 2 weeks.  Will make recommendation for a future colonoscopy at that time  Resume previous diet and medications today.  Avoid for 2 weeks any Nonsteroidal anti-inflammatory medications ( Motrin, Ibuprofen, Aspirin, Alleve)  Return to your normal daily activities tomorrow, 12/01/2020.    YOU HAD AN ENDOSCOPIC PROCEDURE TODAY AT [facility]:   Refer to the procedure report that was given to you for any specific questions about what was found during the examination.  If the procedure report does not answer your questions, please call your gastroenterologist to clarify.  If you requested that your care partner not be given the details of your procedure findings, then the procedure report has been included in a sealed envelope for you to review at your convenience later.  YOU SHOULD EXPECT: Some feelings of bloating in the abdomen. Passage of more gas than usual.  Walking can help get rid of the air that was put into your GI tract during the procedure and reduce the bloating. If you had a lower endoscopy (such as a colonoscopy or flexible sigmoidoscopy) you may notice spotting of blood in your stool or on the toilet paper. If you underwent a bowel prep for your procedure, you may not have a normal bowel movement for a few days.  Please Note:  You might notice some irritation and congestion in your nose or some drainage.  This is from the oxygen used during your procedure.  There is no need for concern and it should clear up in a day or so.  SYMPTOMS TO REPORT IMMEDIATELY:   Following lower endoscopy (colonoscopy or flexible sigmoidoscopy):  Excessive amounts of blood in the stool  Significant tenderness or worsening of abdominal pains  Swelling of the abdomen that is new, acute  Fever of 100F or higher   For urgent or emergent issues, a gastroenterologist can be reached at any  hour by calling [phone]. Do not use MyChart messaging for urgent concerns.    DIET:  We do recommend a small meal at first, but then you may proceed to your regular diet.  Drink plenty of fluids but you should avoid alcoholic beverages for 24 hours.  ACTIVITY:  You should plan to take it easy for the rest of today and you should NOT DRIVE or use heavy machinery until tomorrow (because of the sedation medicines used during the test).    FOLLOW UP: Our staff will call the number listed on your records 48-72 hours following your procedure to check on you and address any questions or concerns that you may have regarding the information given to you following your procedure. If we do not reach you, we will leave a message.  We will attempt to reach you two times.  During this call, we will ask if you have developed any symptoms of COVID 19. If you develop any symptoms (ie: fever, flu-like symptoms, shortness of breath, cough etc.) before then, please call [phone].  If you test positive for Covid 19 in the 2 weeks post procedure, please call and report this information to [place].    If any biopsies were taken you will be contacted by phone or by letter within the next 1-3 weeks.  Please call us at [phone] if you have not heard about the biopsies in 3 weeks.    SIGNATURES/CONFIDENTIALITY: You and/or your care partner have signed paperwork which will  be entered into your electronic medical record.  These signatures attest to the fact that that the information above on your After Visit Summary has been reviewed and is understood.  Full responsibility of the confidentiality of this discharge information lies with you and/or your care-partner. 

## 2020-11-30 NOTE — Progress Notes (Signed)
PT taken to PACU. Monitors in place. VSS. Report given to RN. 

## 2020-11-30 NOTE — Progress Notes (Signed)
Pt's states no medical or surgical changes since previsit or office visit. 

## 2020-11-30 NOTE — Op Note (Signed)
Marvin Lam Patient Name: Marvin Lam Procedure Date: 11/30/2020 11:33 AM MRN: [id number] Endoscopist: Remo Lipps P. Havery Moros , MD Age: 50 Referring MD:  Date of Birth: 1971/04/09 Gender: Male Account #: [id number] Procedure:                Colonoscopy Indications:              Screening for colorectal malignant neoplasm, This                            is the patient's first colonoscopy Medicines:                Monitored Anesthesia Care Procedure:                Pre-Anesthesia Assessment:                           - Prior to the procedure, a History and Physical                            was performed, and patient medications and                            allergies were reviewed. The patient's tolerance of                            previous anesthesia was also reviewed. The risks                            and benefits of the procedure and the sedation                            options and risks were discussed with the patient.                            All questions were answered, and informed consent                            was obtained. Prior Anticoagulants: The patient has                            taken no previous anticoagulant or antiplatelet                            agents. ASA Grade Assessment: II - A patient with                            mild systemic disease. After reviewing the risks                            and benefits, the patient was deemed in                            satisfactory condition to undergo the procedure.  After obtaining informed consent, the colonoscope                            was passed under direct vision. Throughout the                            procedure, the patient's blood pressure, pulse, and                            oxygen saturations were monitored continuously. The                            Olympus CF-HQ190 [id number]) Colonoscope was                            introduced through the anus  and advanced to the the                            cecum, identified by appendiceal orifice and                            ileocecal valve. The colonoscopy was performed                            without difficulty. The patient tolerated the                            procedure well. The quality of the bowel                            preparation was good. The ileocecal valve,                            appendiceal orifice, and rectum were photographed. Scope In: 11:40:14 AM Scope Out: 12:00:16 PM Scope Withdrawal Time: 0 hours 17 minutes 53 seconds  Total Procedure Duration: 0 hours 20 minutes 2 seconds  Findings:                 The perianal and digital rectal examinations were                            normal.                           A 5 mm polyp was found in the sigmoid colon. The                            polyp was pedunculated. The polyp was removed with                            a hot snare. Resection and retrieval were complete.                           A 3 mm polyp was found in the rectum. The polyp was  sessile. The polyp was removed with a cold snare.                            Resection and retrieval were complete.                           A smooth 5 to 6 mm polyp was found in the distal                            rectum. It had a whitish hue and hard to palpation,                            concerning for possible carcinoid lesion. The polyp                            was removed with a hot snare. Resection and                            retrieval were complete. Area laterally to the left                            was tattooed with an injection of Spot (carbon                            black).                           Internal hemorrhoids were found during                            retroflexion. The hemorrhoids were small.                           The exam was otherwise without abnormality. Complications:            No immediate  complications. Estimated blood loss:                            Minimal. Estimated Blood Loss:     Estimated blood loss was minimal. Impression:               - One 5 mm polyp in the sigmoid colon, removed with                            a hot snare. Resected and retrieved.                           - One 3 mm polyp in the rectum, removed with a cold                            snare. Resected and retrieved.                           - One 5 to 6 mm polypoid lesion in  the rectum,                            removed with a hot snare. Resected and retrieved.                            Tattooed.                           - Internal hemorrhoids.                           - The examination was otherwise normal. Recommendation:           - Patient has a contact number available for                            emergencies. The signs and symptoms of potential                            delayed complications were discussed with the                            patient. Return to normal activities tomorrow.                            Written discharge instructions were provided to the                            patient.                           - Resume previous diet.                           - Continue present medications.                           - Await pathology results.                           - No ibuprofen, naproxen, or other non-steroidal                            anti-inflammatory drugs for 2 weeks after polyp                            removal. Remo Lipps P. Dorothye Berni, MD 11/30/2020 12:05:39 PM This report has been signed electronically.

## 2020-12-02 ENCOUNTER — Telehealth: Payer: Self-pay

## 2020-12-02 NOTE — Telephone Encounter (Signed)
  Follow up Call-  Call back number 11/30/2020  Post procedure Call Back phone  # [phone]  Permission to leave phone message Yes  Some recent data might be hidden     Patient questions:  Do you have a fever, pain , or abdominal swelling? No. Pain Score  0 *  Have you tolerated food without any problems? Yes.    Have you been able to return to your normal activities? Yes.    Do you have any questions about your discharge instructions: Diet   No. Medications  No. Follow up visit  No.  Do you have questions or concerns about your Care? No.  Actions: * If pain score is 4 or above: No action needed, pain <4.  1. Have you developed a fever since your procedure? no  2.   Have you had an respiratory symptoms (SOB or cough) since your procedure? no  3.   Have you tested positive for COVID 19 since your procedure no  4.   Have you had any family members/close contacts diagnosed with the COVID 19 since your procedure?  no   If yes to any of these questions please route to Joylene John, RN and Joella Prince, RN

## 2021-04-06 ENCOUNTER — Telehealth: Payer: Self-pay | Admitting: Gastroenterology

## 2021-04-06 DIAGNOSIS — D128 Benign neoplasm of rectum: Secondary | ICD-10-CM

## 2021-04-06 DIAGNOSIS — D125 Benign neoplasm of sigmoid colon: Secondary | ICD-10-CM

## 2021-04-06 NOTE — Telephone Encounter (Signed)
Patient called in to schedule recall procedure EUS60 04/2021   Best contact number [phone]

## 2021-04-07 ENCOUNTER — Other Ambulatory Visit: Payer: Self-pay

## 2021-04-07 DIAGNOSIS — D125 Benign neoplasm of sigmoid colon: Secondary | ICD-10-CM

## 2021-04-07 DIAGNOSIS — D128 Benign neoplasm of rectum: Secondary | ICD-10-CM

## 2021-04-07 NOTE — Telephone Encounter (Signed)
Left message for patient asking for return call to get him scheduled for EUS at the hospital.

## 2021-04-07 NOTE — Telephone Encounter (Signed)
Thanks for the update. No changes. GM

## 2021-04-07 NOTE — Telephone Encounter (Signed)
Dr. Rush Landmark - Patient has been scheduled as below. Please review orders and let me know if there is anything that I missed or needs to be added.   Patient has been scheduled for lower EUS on 05/05/21 @ [facility]. TOA 9:30am for Proc time of 11:00am. Pt has been informed and information has been sent to pt via mychart and mailed. Pt knows to contact office with any questions or concerns after receiving paperwork.

## 2021-04-07 NOTE — Progress Notes (Signed)
Patient has been scheduled for EUS 05/05/21 @ [facility].

## 2021-04-26 ENCOUNTER — Telehealth: Payer: Self-pay | Admitting: Gastroenterology

## 2021-04-26 NOTE — Telephone Encounter (Signed)
Magnesium Citrate is no longer available. Would you like for patient to have full colonoscopy prep or 2 fleets enema in the place of the Magnesium Citrate? Please advise?

## 2021-04-26 NOTE — Telephone Encounter (Signed)
Would give one half MiraLAX preparation +1 enema the morning of procedure since magnesium citrate is no longer available. Thanks. GM

## 2021-04-26 NOTE — Telephone Encounter (Signed)
Spoke with patient. Gave him instructions for Flex Sig. Instructions were mailed to pt and sent via mychart. Pt voiced understanding.

## 2021-04-26 NOTE — Telephone Encounter (Addendum)
  SIGMOIDOSCOPY PREP:   OVER THE COUNTER SHOPPING GUIDE: Purchase 1 (one) 119 GRAM bottle of Miralax Purchase 32 ounces of Gatorade 1 Fleets Enemas   DAY BEFORE YOUR PROCEDURE:   In the morning, COMBINE TOGETHER the 32 ounces of Gatorade and the 119 gram bottle of Miralax, then refrigerate. At 5pm, drink the Miralax solution over the next 2-3 hours. You should expect results within 1 to 6 hours.   DAY OF YOUR PROCEDURE:   1 hour BEFORE getting in your car, insert rectally and administer the fluid of 1 (one) first Fleets enema, then repeat the process again with the 2nd (second) bottle.    Left message for pt to return call.

## 2021-04-26 NOTE — Progress Notes (Signed)
SIGMOIDOSCOPY PREP:   OVER THE COUNTER SHOPPING GUIDE: Purchase 1 (one) 119 GRAM bottle of Miralax Purchase 32 ounces of Gatorade 2 Fleets Enemas   DAY BEFORE YOUR PROCEDURE:   In the morning, COMBINE TOGETHER the 32 ounces of Gatorade and the 119 gram bottle of Miralax, then refrigerate. At 5pm, drink the Miralax solution over the next 2-3 hours. You should expect results within 1 to 6 hours.   DAY OF YOUR PROCEDURE:   1 hour BEFORE getting in your car, insert rectally and administer the fluid of 1 (one) first Fleets enema, then repeat the process again with the 2nd (second) bottle.

## 2021-05-03 ENCOUNTER — Other Ambulatory Visit: Payer: Self-pay

## 2021-05-03 ENCOUNTER — Encounter (HOSPITAL_COMMUNITY): Payer: Self-pay | Admitting: Gastroenterology

## 2021-05-03 NOTE — Progress Notes (Signed)
PCP - Dr. Zigmund Daniel Cardiologist -  EKG -  Chest x-ray -  ECHO -  Cardiac Cath -  CPAP -   ERAS Protcol -   COVID TEST- n/a  Anesthesia review: n/a  -------------  SDW INSTRUCTIONS:  Your procedure is scheduled on Thursday 10/6. Please report to [facility] Main Entrance "A" at 1000 A.M., and check in at the Admitting office. Call this number if you have problems the morning of surgery: [phone]   Remember: Do not eat or drink after midnight the night before your surgery   Medications to take morning of surgery with a sip of water include: NONE  As of today, STOP taking any Aspirin (unless otherwise instructed by your surgeon), Aleve, Naproxen, Ibuprofen, Motrin, Advil, Goody's, BC's, all herbal medications, fish oil, and all vitamins.    The Morning of Surgery Do not wear jewelry Do not wear lotions, powders, or colognes, or deodorant Do not bring valuables to the hospital. [facility] is not responsible for any belongings or valuables.  If you are a smoker, DO NOT Smoke 24 hours prior to surgery  If you wear a CPAP at night please bring your mask the morning of surgery   Remember that you must have someone to transport you home after your surgery, and remain with you for 24 hours if you are discharged the same day.  Please bring cases for contacts, glasses, hearing aids, dentures or bridgework because it cannot be worn into surgery.   Patients discharged the day of surgery will not be allowed to drive home.   Please shower the NIGHT BEFORE/MORNING OF SURGERY (use antibacterial soap like DIAL soap if possible). Wear comfortable clothes the morning of surgery. Oral Hygiene is also important to reduce your risk of infection.  Remember - BRUSH YOUR TEETH THE MORNING OF SURGERY WITH YOUR REGULAR TOOTHPASTE  Patient denies shortness of breath, fever, cough and chest pain.

## 2021-05-05 ENCOUNTER — Ambulatory Visit (HOSPITAL_COMMUNITY): Payer: 59 | Admitting: Certified Registered Nurse Anesthetist

## 2021-05-05 ENCOUNTER — Encounter (HOSPITAL_COMMUNITY)
Admission: RE | Disposition: A | Discharge status: Home / Self Care | Payer: Self-pay | Source: Home / Self Care | Attending: Gastroenterology

## 2021-05-05 ENCOUNTER — Ambulatory Visit (HOSPITAL_COMMUNITY)
Admission: RE | Admit: 2021-05-05 | Discharge: 2021-05-05 | Disposition: A | Discharge status: Home / Self Care | Payer: 59 | Attending: Gastroenterology | Admitting: Gastroenterology

## 2021-05-05 ENCOUNTER — Encounter (HOSPITAL_COMMUNITY): Payer: Self-pay | Admitting: Gastroenterology

## 2021-05-05 DIAGNOSIS — Z09 Encounter for follow-up examination after completed treatment for conditions other than malignant neoplasm: Secondary | ICD-10-CM | POA: Diagnosis not present

## 2021-05-05 DIAGNOSIS — Z9889 Other specified postprocedural states: Secondary | ICD-10-CM | POA: Insufficient documentation

## 2021-05-05 DIAGNOSIS — K640 First degree hemorrhoids: Secondary | ICD-10-CM | POA: Diagnosis not present

## 2021-05-05 DIAGNOSIS — K6289 Other specified diseases of anus and rectum: Secondary | ICD-10-CM | POA: Diagnosis not present

## 2021-05-05 DIAGNOSIS — D3A8 Other benign neuroendocrine tumors: Secondary | ICD-10-CM | POA: Diagnosis present

## 2021-05-05 DIAGNOSIS — Z87891 Personal history of nicotine dependence: Secondary | ICD-10-CM | POA: Insufficient documentation

## 2021-05-05 DIAGNOSIS — D128 Benign neoplasm of rectum: Secondary | ICD-10-CM

## 2021-05-05 DIAGNOSIS — D125 Benign neoplasm of sigmoid colon: Secondary | ICD-10-CM

## 2021-05-05 HISTORY — PX: HEMOSTASIS CLIP PLACEMENT: SHX6857

## 2021-05-05 HISTORY — PX: BIOPSY: SHX5522

## 2021-05-05 HISTORY — PX: EUS: SHX5427

## 2021-05-05 HISTORY — PX: COLONOSCOPY: SHX5424

## 2021-05-05 SURGERY — ULTRASOUND, LOWER GI TRACT, ENDOSCOPIC
Anesthesia: Monitor Anesthesia Care

## 2021-05-05 MED ORDER — SODIUM CHLORIDE 0.9 % IV SOLN: INTRAVENOUS | Status: DC

## 2021-05-05 MED ORDER — PROPOFOL 500 MG/50ML IV EMUL
INTRAVENOUS | Status: DC | PRN
  Administered 2021-05-05: 75 ug/kg/min via INTRAVENOUS
  Administered 2021-05-05: 125 ug/kg/min via INTRAVENOUS

## 2021-05-05 MED ORDER — GLYCOPYRROLATE 0.2 MG/ML IJ SOLN
INTRAMUSCULAR | Status: DC | PRN
  Administered 2021-05-05: .2 mg via INTRAVENOUS

## 2021-05-05 MED ORDER — LACTATED RINGERS IV SOLN: INTRAVENOUS | Status: DC | PRN

## 2021-05-05 MED ORDER — PROPOFOL 10 MG/ML IV BOLUS
INTRAVENOUS | Status: DC | PRN
  Administered 2021-05-05: 50 mg via INTRAVENOUS
  Administered 2021-05-05: 10 mg via INTRAVENOUS

## 2021-05-05 NOTE — Anesthesia Preprocedure Evaluation (Addendum)
Anesthesia Evaluation  Patient identified by MRN, date of birth, ID band Patient awake    Reviewed: Allergy & Precautions, NPO status , Patient's Chart, lab work & pertinent test results  Airway Mallampati: II  TM Distance: >3 FB Neck ROM: Full    Dental no notable dental hx. (+) Teeth Intact, Dental Advisory Given   Pulmonary neg pulmonary ROS, former smoker,    Pulmonary exam normal breath sounds clear to auscultation       Cardiovascular Normal cardiovascular exam Rhythm:Regular Rate:Normal  HLD   Neuro/Psych negative neurological ROS  negative psych ROS   GI/Hepatic negative GI ROS, Neg liver ROS,   Endo/Other  negative endocrine ROS  Renal/GU negative Renal ROS  negative genitourinary   Musculoskeletal negative musculoskeletal ROS (+)   Abdominal   Peds  Hematology negative hematology ROS (+)   Anesthesia Other Findings Colonoscopy for polyps  Reproductive/Obstetrics                            Anesthesia Physical Anesthesia Plan  ASA: 2  Anesthesia Plan: MAC   Post-op Pain Management:    Induction: Intravenous  PONV Risk Score and Plan: Propofol infusion and Treatment may vary due to age or medical condition  Airway Management Planned: Natural Airway  Additional Equipment:   Intra-op Plan:   Post-operative Plan:   Informed Consent: I have reviewed the patients History and Physical, chart, labs and discussed the procedure including the risks, benefits and alternatives for the proposed anesthesia with the patient or authorized representative who has indicated his/her understanding and acceptance.     Dental advisory given  Plan Discussed with: CRNA  Anesthesia Plan Comments:         Anesthesia Quick Evaluation

## 2021-05-05 NOTE — Transfer of Care (Signed)
Immediate Anesthesia Transfer of Care Note  Patient: Gunnison Chahal Kopper  Procedure(s) Performed: LOWER ENDOSCOPIC ULTRASOUND (EUS) BIOPSY HEMOSTASIS CLIP PLACEMENT  Patient Location: Endoscopy Unit  Anesthesia Type:MAC  Level of Consciousness: drowsy  Airway & Oxygen Therapy: Patient Spontanous Breathing and Patient connected to nasal cannula oxygen  Post-op Assessment: Report given to RN and Post -op Vital signs reviewed and stable  Post vital signs: Reviewed and stable  Last Vitals:  Vitals Value Taken Time  BP 98/78 05/05/21 1246  Temp    Pulse 72 05/05/21 1246  Resp 13 05/05/21 1246  SpO2 98 % 05/05/21 1246  Vitals shown include unvalidated device data.  Last Pain:  Vitals:   05/05/21 1246  TempSrc: Temporal  PainSc: 0-No pain         Complications: No notable events documented.

## 2021-05-05 NOTE — Op Note (Signed)
[facility] Patient Name: Marvin Lam Procedure Date : 05/05/2021 MRN: [id number] Attending MD: Justice Britain , MD Date of Birth: 19-Dec-1970 CSN: [id number] Age: 50 Admit Type: Outpatient Procedure:                Lower EUS Indications:              Rectal deformity found on endoscopy; subepithelial                            tumor versus extrinsic compression, Neuroendocrine                            Tumor Providers:                Justice Britain, MD, Jeanella Cara, RN,                            Theodora Blow, Technician Referring MD:             Carlota Raspberry. Havery Moros, MD, Dr. Zigmund Daniel Medicines:                Monitored Anesthesia Care Complications:            No immediate complications. Estimated Blood Loss:     Estimated blood loss was minimal. Procedure:                Pre-Anesthesia Assessment:                           - Prior to the procedure, a History and Physical                            was performed, and patient medications and                            allergies were reviewed. The patient's tolerance of                            previous anesthesia was also reviewed. The risks                            and benefits of the procedure and the sedation                            options and risks were discussed with the patient.                            All questions were answered, and informed consent                            was obtained. Prior Anticoagulants: The patient has                            taken no previous anticoagulant or antiplatelet                            agents.  ASA Grade Assessment: II - A patient with                            mild systemic disease. After reviewing the risks                            and benefits, the patient was deemed in                            satisfactory condition to undergo the procedure.                           After obtaining informed consent, the endoscope was                             passed under direct vision. Throughout the                            procedure, the patient's blood pressure, pulse, and                            oxygen saturations were monitored continuously. The                            [id number] ([id number]) Olympus endoscope was                            introduced through the anus and advanced to the the                            left transverse colon. The [id number] ([id number])                            Olympus Radial EUS scope was introduced through the                            anus and advanced to the the sigmoid colon for                            ultrasound. The lower EUS was accomplished without                            difficulty. The patient tolerated the procedure.                            The quality of the bowel preparation was adequate. Scope In: 12:09:04 PM Scope Out: 12:38:16 PM Total Procedure Duration: 0 hours 29 minutes 12 seconds  Findings:      ENDOSCOPIC FINDING: :      Normal mucosa was found in the recto-sigmoid colon, in the sigmoid colon       and in the descending colon.      A medium post polypectomy scar with tattoo throughout the region and       this was found in the mid  rectum. There was polypoid appearing tissue at       the periphery of the scar. After the EUS this was sampled with a cold       snare for histology. To prevent bleeding post-intervention, two       hemostatic clips were successfully placed (MR conditional). There was no       bleeding at the end of the procedure.      Non-bleeding non-thrombosed internal hemorrhoids were found during       retroflexion. The hemorrhoids were Grade I (internal hemorrhoids that do       not prolapse).      ENDOSONOGRAPHIC FINDING: :      The rectum was normal with scar site showing no significant changes in       the wall.      The perirectal space was normal.      The rectosigmoid junction was normal.      No malignant-appearing lymph nodes  were visualized in the perirectal       region and in the left iliac region. The nodes were.      The internal anal sphincter was visualized endosonographically and       appeared normal. Impression:               - Normal mucosa in the recto-sigmoid colon, in the                            sigmoid colon and in the descending colon.                           - Post-polypectomy scar in the mid rectum.                            Biopsied. Clips (MR conditional) were placed.                           - Non-bleeding non-thrombosed internal hemorrhoids.                           - Endosonographic images of the rectum were                            unremarkable.                           - Endosonographic images of the perirectal space                            were unremarkable.                           - Endosonographic imaging showed no sign of                            significant pathology at the rectosigmoid junction.                           - No malignant-appearing lymph nodes were  visualized endosonographically in the perirectal                            region and in the left iliac region.                           - The internal anal sphincter was visualized                            endosonographically and appeared normal. Recommendation:           - The patient will be observed post-procedure,                            until all discharge criteria are met.                           - Discharge patient to home.                           - Patient has a contact number available for                            emergencies. The signs and symptoms of potential                            delayed complications were discussed with the                            patient. Return to normal activities tomorrow.                            Written discharge instructions were provided to the                            patient.                           - High  fiber diet.                           - Continue present medications.                           - Await path results.                           - Repeat lower endoscopic ultrasound in 1 year for                            surveillance.                           - The findings and recommendations were discussed                            with the patient. Procedure Code(s):        ---  Professional ---                           [phone], Sigmoidoscopy, flexible; with endoscopic                            ultrasound examination                           [phone], Sigmoidoscopy, flexible; with removal of                            tumor(s), polyp(s), or other lesion(s) by snare                            technique Diagnosis Code(s):        --- Professional ---                           K64.0, First degree hemorrhoids                           Z98.890, Other specified postprocedural states                           I89.9, Noninfective disorder of lymphatic vessels                            and lymph nodes, unspecified                           K62.89, Other specified diseases of anus and rectum CPT copyright 2019 American [employer] Association. All rights reserved. The codes documented in this report are preliminary and upon coder review may  be revised to meet current compliance requirements. Justice Britain, MD 05/05/2021 12:53:39 PM Number of Addenda: 0

## 2021-05-05 NOTE — Anesthesia Procedure Notes (Signed)
Procedure Name: MAC Date/Time: 05/05/2021 12:00 PM Performed by: Lowella Dell, CRNA Pre-anesthesia Checklist: Patient identified, Emergency Drugs available, Suction available, Patient being monitored and Timeout performed Patient Re-evaluated:Patient Re-evaluated prior to induction Oxygen Delivery Method: Nasal cannula Placement Confirmation: positive ETCO2 Dental Injury: Teeth and Oropharynx as per pre-operative assessment

## 2021-05-05 NOTE — H&P (Signed)
GASTROENTEROLOGY PROCEDURE H&P NOTE   Primary Care Physician: Luetta Nutting, DO  HPI: Marvin Lam is a 50 y.o. 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  Past Medical History:  Diagnosis Date   Glaucoma    UTI (lower urinary tract infection)    Past Surgical History:  Procedure Laterality Date   EYE SURGERY     LASIK     Current Facility-Administered Medications  Medication Dose Route Frequency Provider Last Rate Last Admin   0.9 %  sodium chloride infusion   Intravenous Continuous Mansouraty, Telford Nab., MD        Current Facility-Administered Medications:    0.9 %  sodium chloride infusion, , Intravenous, Continuous, Mansouraty, Telford Nab., MD No Known Allergies Family History  Problem Relation Age of Onset   Diabetes Mother    Kidney disease Mother    Glaucoma Father    GER disease Father    Stomach cancer Paternal Uncle    Colon cancer Neg Hx    Colon polyps Neg Hx    Esophageal cancer Neg Hx    Rectal cancer Neg Hx    Social History   Socioeconomic History   Marital status: Single    Spouse name: Not on file   Number of children: Not on file   Years of education: Not on file   Highest education level: Not on file  Occupational History   Not on file  Tobacco Use   Smoking status: Former   Smokeless tobacco: Never  Vaping Use   Vaping Use: Never used  Substance and Sexual Activity   Alcohol use: No   Drug use: No   Sexual activity: Yes    Partners: Female    Birth control/protection: Condom  Other Topics Concern   Not on file  Social History Narrative   Not on file   Social Determinants of Health   Financial Resource Strain: Not on file  Food Insecurity: Not on file  Transportation Needs: Not on file  Physical Activity: Not on file  Stress: Not on file  Social Connections: Not on file  Intimate Partner Violence: Not on file    Physical Exam: Today's  Vitals   05/03/21 1843 05/05/21 1030  BP:  126/81  Pulse:  66  Resp:  16  Temp:  (!) 97.3 F (36.3 C)  TempSrc:  Temporal  SpO2:  99%  Weight: 94.8 kg 94.3 kg  Height: 5\' 9"  (1.753 m) 5\' 9"  (1.753 m)  PainSc:  0-No pain   Body mass index is 30.72 kg/m. GEN: NAD EYE: Sclerae anicteric ENT: MMM CV: Non-tachycardic GI: Soft, NT/ND NEURO:  Alert & Oriented x 3  Lab Results: No results for input(s): WBC, HGB, HCT, PLT in the last 72 hours. BMET No results for input(s): NA, K, CL, CO2, GLUCOSE, BUN, CREATININE, CALCIUM in the last 72 hours. LFT No results for input(s): PROT, ALBUMIN, AST, ALT, ALKPHOS, BILITOT, BILIDIR, IBILI in the last 72 hours. PT/INR No results for input(s): LABPROT, INR in the last 72 hours.   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  The risks of an EUS including intestinal perforation, bleeding, infection, aspiration, and medication effects were discussed as was the possibility it may not give a definitive diagnosis if a biopsy is performed.  The risks and benefits of endoscopic evaluation/treatment were discussed with the patient and/or family; these include but are not limited to the risk of perforation, infection, bleeding, missed lesions, lack of diagnosis, severe illness requiring hospitalization, as well as anesthesia and sedation related illnesses.  The patient's history has been reviewed, patient examined, no change in status, and deemed stable for procedure.  The patient and/or family is agreeable to proceed.    Justice Britain, MD [facility] Office # [phone]

## 2021-05-06 ENCOUNTER — Encounter: Payer: Self-pay | Admitting: Gastroenterology

## 2021-05-06 ENCOUNTER — Encounter (HOSPITAL_COMMUNITY): Payer: Self-pay | Admitting: Gastroenterology

## 2021-05-06 ENCOUNTER — Other Ambulatory Visit: Payer: Self-pay

## 2021-05-06 DIAGNOSIS — D128 Benign neoplasm of rectum: Secondary | ICD-10-CM

## 2021-05-06 LAB — SURGICAL PATHOLOGY

## 2021-05-06 NOTE — Anesthesia Postprocedure Evaluation (Signed)
Anesthesia Post Note  Patient: Marvin Lam  Procedure(s) Performed: LOWER ENDOSCOPIC ULTRASOUND (EUS) BIOPSY HEMOSTASIS CLIP PLACEMENT     Patient location during evaluation: Endoscopy Anesthesia Type: MAC Level of consciousness: awake and alert Pain management: pain level controlled Vital Signs Assessment: post-procedure vital signs reviewed and stable Respiratory status: spontaneous breathing, nonlabored ventilation, respiratory function stable and patient connected to nasal cannula oxygen Cardiovascular status: blood pressure returned to baseline and stable Postop Assessment: no apparent nausea or vomiting Anesthetic complications: no   No notable events documented.  Last Vitals:  Vitals:   05/05/21 1255 05/05/21 1306  BP: 107/76 104/81  Pulse: (!) 55 (!) 59  Resp: 11 12  Temp:    SpO2: 98% 100%    Last Pain:  Vitals:   05/05/21 1306  TempSrc:   PainSc: 0-No pain                 [name] [name]

## 2021-05-09 ENCOUNTER — Other Ambulatory Visit: Payer: 59

## 2021-05-09 DIAGNOSIS — D128 Benign neoplasm of rectum: Secondary | ICD-10-CM

## 2021-05-11 LAB — CHROMOGRANIN A: Chromogranin A (ng/mL): 28.5 ng/mL (ref 0.0–101.8)

## 2021-06-21 ENCOUNTER — Ambulatory Visit: Payer: 59 | Admitting: Family Medicine

## 2021-06-27 ENCOUNTER — Encounter: Payer: Self-pay | Admitting: Physician Assistant

## 2021-06-27 ENCOUNTER — Other Ambulatory Visit: Payer: Self-pay

## 2021-06-27 ENCOUNTER — Ambulatory Visit (INDEPENDENT_AMBULATORY_CARE_PROVIDER_SITE_OTHER): Payer: 59 | Admitting: Physician Assistant

## 2021-06-27 VITALS — BP 114/72 | HR 90 | Ht 70.0 in | Wt 234.0 lb

## 2021-06-27 DIAGNOSIS — R200 Anesthesia of skin: Secondary | ICD-10-CM

## 2021-06-27 DIAGNOSIS — M25511 Pain in right shoulder: Secondary | ICD-10-CM

## 2021-06-27 DIAGNOSIS — G8929 Other chronic pain: Secondary | ICD-10-CM | POA: Diagnosis not present

## 2021-06-27 DIAGNOSIS — M25512 Pain in left shoulder: Secondary | ICD-10-CM

## 2021-06-27 DIAGNOSIS — R202 Paresthesia of skin: Secondary | ICD-10-CM | POA: Diagnosis not present

## 2021-06-27 MED ORDER — BACLOFEN 10 MG PO TABS
10.0000 mg | ORAL_TABLET | Freq: Every day | ORAL | 0 refills | Status: DC
Start: 2021-06-27 — End: 2021-09-27

## 2021-06-27 NOTE — Progress Notes (Incomplete)
° °  Subjective:    Patient ID: Marvin Lam, male    DOB: 1971/06/12, 50 y.o.   MRN: [id number]  HPI    Review of Systems     Objective:   Physical Exam        Assessment & Plan:

## 2021-06-27 NOTE — Progress Notes (Signed)
   Subjective:    Patient ID: Marvin Lam, male    DOB: 1971/06/18, 50 y.o.   MRN: [id number]  HPI 50 y.o male presenting to the clinic for bilateral shoulder pain for over a year but getting worse.  Pt states that the pain is only when he is sleeping on the affected shoulder at night, causing him to toss and turn. States the pain is dull in nature and does not affect him during the day. States his arms will occassionally go numb if he lays on his shoulder too long. Pt attributes his pain to his mattress since he is a side sleeper. He has purchased five mattresses in the past two years to try to alleviate his pain. No relief with Ibuprofen. Denies headaches, neck pain, vision changes, or decreased range of motion.   .. Active Ambulatory Problems    Diagnosis Date Noted   DYSLIPIDEMIA 03/09/2007   GLAUCOMA NOS 03/09/2007   DYSURIA 03/09/2007   Globus sensation 02/11/2019   COVID-19 08/24/2020   Left lower quadrant abdominal pain 09/15/2020   Bilateral arm numbness and tingling while sleeping 06/28/2021   Chronic pain of both shoulders 06/28/2021   Resolved Ambulatory Problems    Diagnosis Date Noted   No Resolved Ambulatory Problems   Past Medical History:  Diagnosis Date   Glaucoma    UTI (lower urinary tract infection)       Review of Systems See HPI.     Objective:   Physical Exam Vitals reviewed.  HENT:     Head: Normocephalic.  Cardiovascular:     Rate and Rhythm: Normal rate.  Musculoskeletal:     Comments: Bilateral ROM of both shoulders full and without pain.  5/5 upper ext strength.  5/5 hand grip.  Negative phalens and tinels.  No tenderness to palpation of shoulders.  NROM of neck.  No pain over c-spine to palpation.   Neurological:     Mental Status: He is alert.  Psychiatric:        Mood and Affect: Mood normal.          Assessment & Plan:  [name] was seen today for acute visit.  Diagnoses and all orders for this visit:  Chronic pain of  both shoulders -     baclofen (LIORESAL) 10 MG tablet; Take 1 tablet (10 mg total) by mouth at bedtime.  Bilateral arm numbness and tingling while sleeping -     baclofen (LIORESAL) 10 MG tablet; Take 1 tablet (10 mg total) by mouth at bedtime.   Discussed massages and ? Formal PT for muscle tension and purchasing a cervical neck pillow with an arm hole to decrease pressure on his shoulders & arms. Pain is only at night. If not improving in 2 weeks see Dr. Darene Lamer.

## 2021-06-28 DIAGNOSIS — R200 Anesthesia of skin: Secondary | ICD-10-CM | POA: Insufficient documentation

## 2021-06-28 DIAGNOSIS — G8929 Other chronic pain: Secondary | ICD-10-CM | POA: Insufficient documentation

## 2021-09-27 ENCOUNTER — Other Ambulatory Visit: Payer: Self-pay

## 2021-09-27 ENCOUNTER — Encounter: Payer: Self-pay | Admitting: Physician Assistant

## 2021-09-27 ENCOUNTER — Ambulatory Visit (INDEPENDENT_AMBULATORY_CARE_PROVIDER_SITE_OTHER): Payer: Managed Care, Other (non HMO) | Admitting: Physician Assistant

## 2021-09-27 VITALS — BP 115/81 | HR 78 | Ht 70.0 in | Wt 223.0 lb

## 2021-09-27 DIAGNOSIS — R1032 Left lower quadrant pain: Secondary | ICD-10-CM | POA: Diagnosis not present

## 2021-09-27 DIAGNOSIS — D128 Benign neoplasm of rectum: Secondary | ICD-10-CM

## 2021-09-27 DIAGNOSIS — R140 Abdominal distension (gaseous): Secondary | ICD-10-CM

## 2021-09-27 DIAGNOSIS — K429 Umbilical hernia without obstruction or gangrene: Secondary | ICD-10-CM | POA: Diagnosis not present

## 2021-09-27 DIAGNOSIS — K5900 Constipation, unspecified: Secondary | ICD-10-CM | POA: Diagnosis not present

## 2021-09-27 MED ORDER — LINACLOTIDE 290 MCG PO CAPS: 290.0000 ug | ORAL_CAPSULE | Freq: Every day | ORAL | 0 refills | Status: DC

## 2021-09-27 NOTE — Patient Instructions (Addendum)
Left lower quadrant pressure Linzess daily in 2 weeks follow up to consider CT  Constipation, Adult Constipation is when a person has fewer than three bowel movements in a week, has difficulty having a bowel movement, or has stools (feces) that are dry, hard, or larger than normal. Constipation may be caused by an underlying condition. It may become worse with age if a person takes certain medicines and does not take in enough fluids. Follow these instructions at home: Eating and drinking  Eat foods that have a lot of fiber, such as beans, whole grains, and fresh fruits and vegetables. Limit foods that are low in fiber and high in fat and processed sugars, such as fried or sweet foods. These include french fries, hamburgers, cookies, candies, and soda. Drink enough fluid to keep your urine pale yellow. General instructions Exercise regularly or as told by your health care provider. Try to do 150 minutes of moderate exercise each week. Use the bathroom when you have the urge to go. Do not hold it in. Take over-the-counter and prescription medicines only as told by your health care provider. This includes any fiber supplements. During bowel movements: Practice deep breathing while relaxing the lower abdomen. Practice pelvic floor relaxation. Watch your condition for any changes. Let your health care provider know about them. Keep all follow-up visits as told by your health care provider. This is important. Contact a health care provider if: You have pain that gets worse. You have a fever. You do not have a bowel movement after 4 days. You vomit. You are not hungry or you lose weight. You are bleeding from the opening between the buttocks (anus). You have thin, pencil-like stools. Get help right away if: You have a fever and your symptoms suddenly get worse. You leak stool or have blood in your stool. Your abdomen is bloated. You have severe pain in your abdomen. You feel dizzy or you  faint. Summary Constipation is when a person has fewer than three bowel movements in a week, has difficulty having a bowel movement, or has stools (feces) that are dry, hard, or larger than normal. Eat foods that have a lot of fiber, such as beans, whole grains, and fresh fruits and vegetables. Drink enough fluid to keep your urine pale yellow. Take over-the-counter and prescription medicines only as told by your health care provider. This includes any fiber supplements. This information is not intended to replace advice given to you by your health care provider. Make sure you discuss any questions you have with your health care provider. Document Revised: 06/04/2019 Document Reviewed: 06/04/2019 Elsevier Patient Education  [place].

## 2021-09-27 NOTE — Progress Notes (Signed)
Subjective:    Patient ID: Marvin Lam, male    DOB: Nov 15, 1970, 51 y.o.   MRN: [id number]  HPI Pt is a 51 yo male with history of benign rectal neoplasm removed in 04/2021 with 1 year follow up and constipation who presents to the clinic with LLQ pain for 2 weeks. He has also noticed he feels more bloated. A little worse after he eats but no reflux.No urinary symptoms, chills, fever, nausea, vomiting, diarrhea. He reports to be having 2 bowel movements that a soft a day. He denies any melena or hematochezia. Last CT was 08/2020 with no acute findings.   .. Active Ambulatory Problems    Diagnosis Date Noted   DYSLIPIDEMIA 03/09/2007   GLAUCOMA NOS 03/09/2007   DYSURIA 03/09/2007   Globus sensation 02/11/2019   COVID-19 08/24/2020   Left lower quadrant abdominal pain 09/15/2020   Bilateral arm numbness and tingling while sleeping 06/28/2021   Chronic pain of both shoulders 06/28/2021   Constipation 09/27/2021   Bloating 23/76/2831   Umbilical hernia without obstruction and without gangrene 09/27/2021   Rectal benign neoplasm 09/27/2021   Resolved Ambulatory Problems    Diagnosis Date Noted   No Resolved Ambulatory Problems   Past Medical History:  Diagnosis Date   Glaucoma    UTI (lower urinary tract infection)       Review of Systems See HPI.     Objective:   Physical Exam Vitals reviewed.  Constitutional:      Appearance: He is well-developed.  HENT:     Head: Normocephalic.  Cardiovascular:     Rate and Rhythm: Normal rate and regular rhythm.     Heart sounds: Normal heart sounds.  Pulmonary:     Effort: Pulmonary effort is normal.     Breath sounds: Normal breath sounds.  Abdominal:     General: Bowel sounds are normal. There is distension.     Palpations: Abdomen is soft.     Tenderness: There is abdominal tenderness in the left lower quadrant. There is no right CVA tenderness, left CVA tenderness, guarding or rebound. Negative signs include Murphy's sign  and McBurney's sign.     Hernia: A hernia is present. Hernia is present in the umbilical area.  Neurological:     General: No focal deficit present.     Mental Status: He is alert.  Psychiatric:        Mood and Affect: Mood normal.            Assessment & Plan:  Marland KitchenMarland KitchenLeotis was seen today for abdominal pain.  Diagnoses and all orders for this visit:  Left lower quadrant abdominal pain -     linaclotide (LINZESS) 290 MCG CAPS capsule; Take 1 capsule (290 mcg total) by mouth daily.  Constipation, unspecified constipation type -     linaclotide (LINZESS) 290 MCG CAPS capsule; Take 1 capsule (290 mcg total) by mouth daily.  Bloating -     linaclotide (LINZESS) 290 MCG CAPS capsule; Take 1 capsule (290 mcg total) by mouth daily.  Umbilical hernia without obstruction and without gangrene  Rectal benign neoplasm    No red flag signs of symptoms.  LLQ is tender but no guarding or rebound Abdomen is a little distended, umbilical hernia reducible Will start linzess to make sure this is not constipation due to his history In 2 weeks and/or sooner if needed will follow up and consider CT scan Not due for GI follow up until October but we could consider  follow up sooner if needed

## 2022-01-22 ENCOUNTER — Emergency Department (HOSPITAL_COMMUNITY)
Admission: EM | Admit: 2022-01-22 | Discharge: 2022-01-22 | Disposition: A | Discharge status: Home / Self Care | Payer: Commercial Managed Care - HMO | Attending: Emergency Medicine | Admitting: Emergency Medicine

## 2022-01-22 ENCOUNTER — Other Ambulatory Visit: Payer: Self-pay

## 2022-01-22 ENCOUNTER — Encounter (HOSPITAL_COMMUNITY): Payer: Self-pay | Admitting: Emergency Medicine

## 2022-01-22 DIAGNOSIS — R0982 Postnasal drip: Secondary | ICD-10-CM | POA: Diagnosis present

## 2022-01-22 MED ORDER — AMOXICILLIN-POT CLAVULANATE 875-125 MG PO TABS
1.0000 | ORAL_TABLET | Freq: Once | ORAL | Status: AC
  Administered 2022-01-22: 1 via ORAL
  Filled 2022-01-22: qty 1

## 2022-01-22 MED ORDER — AMOXICILLIN-POT CLAVULANATE 875-125 MG PO TABS: 1.0000 | ORAL_TABLET | Freq: Two times a day (BID) | ORAL | 0 refills | Status: DC

## 2022-01-22 NOTE — ED Provider Notes (Signed)
[facility] DEPT Provider Note   CSN: [id number] Arrival date & time: 01/22/22  1757     History  Chief Complaint  Patient presents with   Nasal drip    Marvin Lam is a 51 y.o. male.  Patient presents to the emergency department today for concern of postnasal drip over the past 1 month.  He feels a sensation of drainage from his sinuses down the back of his throat.  This is causing him to have difficulty swallowing.  Patient has had this problem intermittently over the past 5 years.  He has been seen in the emergency department, by ENT, by primary care provider for same.  He has been trialed on antibiotics, antihistamines, steroid nasal sprays, H2 blocker/PPI for reflux.  Patient is currently using saline nasal rinses.  No associated fevers, ear pain, eye drainage, cough.  States that he has Flonase and antihistamine, Claritin, which he has been using without improvement.       Home Medications Prior to Admission medications   Medication Sig Start Date End Date Taking? Authorizing Provider  amoxicillin-clavulanate (AUGMENTIN) 875-125 MG tablet Take 1 tablet by mouth every 12 (twelve) hours. 01/22/22  Yes Carlisle Cater, PA-C  latanoprost (XALATAN) 0.005 % ophthalmic solution Place 1 drop into both eyes at bedtime. 01/10/20   [provider]  linaclotide (LINZESS) 290 MCG CAPS capsule Take 1 capsule (290 mcg total) by mouth daily. 09/27/21   Donella Stade, PA-C      Allergies    Patient has no known allergies.    Review of Systems   Review of Systems  Physical Exam Updated Vital Signs BP 128/87   Pulse 68   Temp 98.1 F (36.7 C)   Resp 18   Ht '5\' 10"'$  (1.778 m)   Wt 101.2 kg   SpO2 100%   BMI 32.00 kg/m  Physical Exam Vitals and nursing note reviewed.  Constitutional:      Appearance: He is well-developed.  HENT:     Head: Normocephalic and atraumatic.     Jaw: No trismus.     Right Ear: Tympanic membrane, ear canal and  external ear normal.     Left Ear: Tympanic membrane, ear canal and external ear normal.     Nose: Mucosal edema and congestion present. No rhinorrhea.     Right Turbinates: Enlarged and swollen.     Left Turbinates: Not enlarged or swollen.     Mouth/Throat:     Mouth: Mucous membranes are not dry.     Pharynx: Uvula midline. No oropharyngeal exudate, posterior oropharyngeal erythema or uvula swelling.     Tonsils: No tonsillar abscesses.  Eyes:     General:        Right eye: No discharge.        Left eye: No discharge.     Conjunctiva/sclera: Conjunctivae normal.  Cardiovascular:     Rate and Rhythm: Normal rate and regular rhythm.     Heart sounds: Normal heart sounds.  Pulmonary:     Effort: Pulmonary effort is normal. No respiratory distress.     Breath sounds: Normal breath sounds. No wheezing or rales.  Abdominal:     Palpations: Abdomen is soft.     Tenderness: There is no abdominal tenderness.  Musculoskeletal:     Cervical back: Normal range of motion and neck supple.  Skin:    General: Skin is warm and dry.  Neurological:     Mental Status: He is alert.  ED Results / Procedures / Treatments   Labs (all labs ordered are listed, but only abnormal results are displayed) Labs Reviewed - No data to display  EKG None  Radiology No results found.  Procedures Procedures    Medications Ordered in ED Medications  amoxicillin-clavulanate (AUGMENTIN) 875-125 MG per tablet 1 tablet (1 tablet Oral Given 01/22/22 1840)    ED Course/ Medical Decision Making/ A&P    Patient seen and examined. History obtained directly from patient.  I also reviewed previous ED notes, PCP clinic notes, ENT notes in regards to this chief complaint.  Labs/EKG: None ordered  Imaging: None ordered  Medications/Fluids: Augmentin  Most recent vital signs reviewed and are as follows: BP 128/87   Pulse 68   Temp 98.1 F (36.7 C)   Resp 18   Ht '5\' 10"'$  (1.778 m)   Wt 101.2 kg    SpO2 100%   BMI 32.00 kg/m   Initial impression: Postnasal drip  Plan: I asked the patient what he thinks with helped him the most.  He would like to try a course of antibiotics.  I encouraged him to continue Flonase and antihistamine.                            Medical Decision Making Risk Prescription drug management.   Patient with nasal congestion and postnasal drip.  He appears well, nontoxic.  Given symptoms greater than 10 days, will give trial of antibiotics.  Encouraged patient to use intranasal steroids and antihistamines as well.  Encourage PCP follow-up.        Final Clinical Impression(s) / ED Diagnoses Final diagnoses:  Post-nasal drip    Rx / DC Orders ED Discharge Orders          Ordered    amoxicillin-clavulanate (AUGMENTIN) 875-125 MG tablet  Every 12 hours        01/22/22 1834              Carlisle Cater, PA-C 01/22/22 [place], [place], DO 01/26/22 1652

## 2022-01-24 ENCOUNTER — Telehealth: Payer: Self-pay | Admitting: Family Medicine

## 2022-01-30 ENCOUNTER — Telehealth: Payer: Self-pay

## 2022-01-30 NOTE — Telephone Encounter (Signed)
Patient has been scheduled for with PCP.

## 2022-01-30 NOTE — Telephone Encounter (Signed)
Pt lvm requesting callback to schedule appt with Dr. Zigmund Daniel. Thanks

## 2022-02-10 ENCOUNTER — Encounter: Payer: Self-pay | Admitting: Family Medicine

## 2022-02-10 ENCOUNTER — Ambulatory Visit (INDEPENDENT_AMBULATORY_CARE_PROVIDER_SITE_OTHER): Payer: Commercial Managed Care - HMO | Admitting: Family Medicine

## 2022-02-10 DIAGNOSIS — R0989 Other specified symptoms and signs involving the circulatory and respiratory systems: Secondary | ICD-10-CM

## 2022-02-10 MED ORDER — AZELASTINE HCL 0.1 % NA SOLN: 2.0000 | Freq: Two times a day (BID) | NASAL | 12 refills | Status: DC

## 2022-02-10 MED ORDER — PANTOPRAZOLE SODIUM 40 MG PO TBEC: 40.0000 mg | DELAYED_RELEASE_TABLET | Freq: Every day | ORAL | 3 refills | Status: DC

## 2022-02-10 NOTE — Progress Notes (Signed)
Marvin Lam - 51 y.o. male MRN [id number]  Date of birth: Dec 21, 1970  Subjective No chief complaint on file.   HPI Marvin Lam is a 51 y.o. male here today for follow up of recent urgent care visit.    Seen at urgent care recently with complaint of postnasal drip.  Drainage down the back of throat with some difficulty swallowing over the past month.  He has been seen by multiple providers including ED, ENT, and our clinic.  Tried several treatment previously including PPI, antihistamine, nasal sprays.   Continues to have symptoms.  Medications have not provided much improvement, although he has not been consistent with use.   Augmentin added at urgent care.  Reports that antibiotics have not helped so far.  Denies fever or chills.  He is having reflux symptoms as well.   ROS:  A comprehensive ROS was completed and negative except as noted per HPI    No Known Allergies  Past Medical History:  Diagnosis Date   Glaucoma    UTI (lower urinary tract infection)     Past Surgical History:  Procedure Laterality Date   BIOPSY  05/05/2021   Procedure: BIOPSY;  Surgeon: Rush Landmark Telford Nab., MD;  Location: [facility];  Service: Gastroenterology;;   COLONOSCOPY N/A 05/05/2021   Procedure: COLONOSCOPY;  Surgeon: Irving Copas., MD;  Location: [facility] ENDOSCOPY;  Service: Gastroenterology;  Laterality: N/A;   EUS N/A 05/05/2021   Procedure: LOWER ENDOSCOPIC ULTRASOUND (EUS);  Surgeon: Irving Copas., MD;  Location: [facility];  Service: Gastroenterology;  Laterality: N/A;   EYE SURGERY     HEMOSTASIS CLIP PLACEMENT  05/05/2021   Procedure: HEMOSTASIS CLIP PLACEMENT;  Surgeon: Rush Landmark Telford Nab., MD;  Location: [facility] ENDOSCOPY;  Service: Gastroenterology;;   LASIK      Social History   Socioeconomic History   Marital status: Single    Spouse name: Not on file   Number of children: Not on file   Years of education: Not on file   Highest education level: Not on  file  Occupational History   Not on file  Tobacco Use   Smoking status: Former   Smokeless tobacco: Never  Vaping Use   Vaping Use: Never used  Substance and Sexual Activity   Alcohol use: No   Drug use: No   Sexual activity: Yes    Partners: Female    Birth control/protection: Condom  Other Topics Concern   Not on file  Social History Narrative   Not on file   Social Determinants of Health   Financial Resource Strain: Low Risk  (02/11/2019)   Overall Financial Resource Strain (CARDIA)    Difficulty of Paying Living Expenses: Not hard at all  Food Insecurity: No Food Insecurity (02/11/2019)   Hunger Vital Sign    Worried About Running Out of Food in the Last Year: Never true    [place] in the Last Year: Never true  Transportation Needs: No Transportation Needs (02/11/2019)   PRAPARE - Hydrologist (Medical): No    Lack of Transportation (Non-Medical): No  Physical Activity: Not on file  Stress: Not on file  Social Connections: Unknown (02/11/2019)   Social Connection and Isolation Panel [NHANES]    Frequency of Communication with Friends and Family: More than three times a week    Frequency of Social Gatherings with Friends and Family: More than three times a week    Attends Religious Services: Not on  file    Active Member of Clubs or Organizations: Not on file    Attends Archivist Meetings: Not on file    Marital Status: Never married    Family History  Problem Relation Age of Onset   Diabetes Mother    Kidney disease Mother    Glaucoma Father    GER disease Father    Stomach cancer Paternal Uncle    Colon cancer Neg Hx    Colon polyps Neg Hx    Esophageal cancer Neg Hx    Rectal cancer Neg Hx     Health Maintenance  Topic Date Due   Zoster Vaccines- Shingrix (1 of 2) Never done   TETANUS/TDAP  02/11/2023 (Originally 02/29/2016)   Hepatitis C Screening  02/11/2023 (Originally 08/05/1988)   HIV Screening  02/11/2023  (Originally 08/05/1985)   COVID-19 Vaccine (1) 02/27/2023 (Originally 02/03/1971)   INFLUENZA VACCINE  02/28/2022   COLONOSCOPY (Pts 45-68yr Insurance coverage will need to be confirmed)  05/05/2022   HPV VACCINES  Aged Out     ----------------------------------------------------------------------------------------------------------------------------------------------------------------------------------------------------------------- Physical Exam BP 121/73 (BP Location: Right Arm)   Pulse 81   Ht '5\' 10"'$  (1.778 m)   Wt 220 lb (99.8 kg)   BMI 31.57 kg/m   Physical Exam Constitutional:      Appearance: Normal appearance.  HENT:     Head: Normocephalic and atraumatic.     Mouth/Throat:     Mouth: Mucous membranes are moist.  Eyes:     General: No scleral icterus. Cardiovascular:     Rate and Rhythm: Normal rate and regular rhythm.  Pulmonary:     Effort: Pulmonary effort is normal.     Breath sounds: Normal breath sounds.  Musculoskeletal:     Cervical back: Neck supple.  Neurological:     Mental Status: He is alert.  Psychiatric:        Mood and Affect: Mood normal.        Behavior: Behavior normal.     ------------------------------------------------------------------------------------------------------------------------------------------------------------------------------------------------------------------- Assessment and Plan  Globus sensation He has not been consistent with pantoprazole use in the past.  Will restart this, use daily.  Advised that it may take a couple of weeks to see improvement.  If not improving can consider GI referral.  Additionally will add astelin for PND.  Instructed to use daily.    Meds ordered this encounter  Medications   pantoprazole (PROTONIX) 40 MG tablet    Sig: Take 1 tablet (40 mg total) by mouth daily.    Dispense:  30 tablet    Refill:  3   azelastine (ASTELIN) 0.1 % nasal spray    Sig: Place 2 sprays into both nostrils 2  (two) times daily. Use in each nostril as directed    Dispense:  30 mL    Refill:  12    Return in about 1 month (around 03/13/2022) for PND/GERD.    This visit occurred during the SARS-CoV-2 public health emergency.  Safety protocols were in place, including screening questions prior to the visit, additional usage of staff PPE, and extensive cleaning of exam room while observing appropriate contact time as indicated for disinfecting solutions.

## 2022-02-10 NOTE — Patient Instructions (Addendum)
Try astelin nasal spray.  Use daily.   Try pantoprazole daily.  It may take a couple of weeks to notice any improvement.    See me again in 1 month.

## 2022-02-10 NOTE — Assessment & Plan Note (Signed)
He has not been consistent with pantoprazole use in the past.  Will restart this, use daily.  Advised that it may take a couple of weeks to see improvement.  If not improving can consider GI referral.  Additionally will add astelin for PND.  Instructed to use daily.

## 2022-03-13 ENCOUNTER — Ambulatory Visit: Payer: Commercial Managed Care - HMO | Admitting: Family Medicine

## 2022-03-28 ENCOUNTER — Ambulatory Visit (INDEPENDENT_AMBULATORY_CARE_PROVIDER_SITE_OTHER): Payer: Commercial Managed Care - HMO | Admitting: Family Medicine

## 2022-03-28 ENCOUNTER — Encounter: Payer: Self-pay | Admitting: Family Medicine

## 2022-03-28 VITALS — BP 118/74 | HR 97 | Ht 70.0 in | Wt 216.0 lb

## 2022-03-28 DIAGNOSIS — J3489 Other specified disorders of nose and nasal sinuses: Secondary | ICD-10-CM | POA: Diagnosis not present

## 2022-03-28 DIAGNOSIS — R0989 Other specified symptoms and signs involving the circulatory and respiratory systems: Secondary | ICD-10-CM

## 2022-03-28 NOTE — Progress Notes (Signed)
Marvin Lam - 51 y.o. male MRN [id number]  Date of birth: 1971-04-13  Subjective Chief Complaint  Patient presents with   Sinus Problem    HPI Marvin Lam is a 51 y.o. male here today for follow up visit. Reports that he continues to have post nasal drip with globus sensation.  He has been seen multiple times for this.  Astelin added at last visit.  Hasn't really noted improvement with this.  He has found that congestion and drainage worsens after smoking CBD.  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    ROS:  A comprehensive ROS was completed and negative except as noted per HPI  No Known Allergies  Past Medical History:  Diagnosis Date   Glaucoma    UTI (lower urinary tract infection)     Past Surgical History:  Procedure Laterality Date   BIOPSY  05/05/2021   Procedure: BIOPSY;  Surgeon: Rush Landmark Telford Nab., MD;  Location: [facility];  Service: Gastroenterology;;   COLONOSCOPY N/A 05/05/2021   Procedure: COLONOSCOPY;  Surgeon: Irving Copas., MD;  Location: [facility] ENDOSCOPY;  Service: Gastroenterology;  Laterality: N/A;   EUS N/A 05/05/2021   Procedure: LOWER ENDOSCOPIC ULTRASOUND (EUS);  Surgeon: Irving Copas., MD;  Location: [facility];  Service: Gastroenterology;  Laterality: N/A;   EYE SURGERY     HEMOSTASIS CLIP PLACEMENT  05/05/2021   Procedure: HEMOSTASIS CLIP PLACEMENT;  Surgeon: Rush Landmark Telford Nab., MD;  Location: [facility] ENDOSCOPY;  Service: Gastroenterology;;   LASIK      Social History   Socioeconomic History   Marital status: Single    Spouse name: Not on file   Number of children: Not on file   Years of education: Not on file   Highest education level: Not on file  Occupational History   Not on file  Tobacco Use   Smoking status: Former   Smokeless tobacco: Never  Vaping Use   Vaping Use: Never used  Substance and Sexual Activity   Alcohol use: No   Drug use: No   Sexual activity: Yes     Partners: Female    Birth control/protection: Condom  Other Topics Concern   Not on file  Social History Narrative   Not on file   Social Determinants of Health   Financial Resource Strain: Low Risk  (02/11/2019)   Overall Financial Resource Strain (CARDIA)    Difficulty of Paying Living Expenses: Not hard at all  Food Insecurity: No Food Insecurity (02/11/2019)   Hunger Vital Sign    Worried About Running Out of Food in the Last Year: Never true    [place] in the Last Year: Never true  Transportation Needs: No Transportation Needs (02/11/2019)   PRAPARE - Hydrologist (Medical): No    Lack of Transportation (Non-Medical): No  Physical Activity: Not on file  Stress: Not on file  Social Connections: Unknown (02/11/2019)   Social Connection and Isolation Panel [NHANES]    Frequency of Communication with Friends and Family: More than three times a week    Frequency of Social Gatherings with Friends and Family: More than three times a week    Attends Religious Services: Not on Advertising copywriter or Organizations: Not on file    Attends Archivist Meetings: Not on file    Marital Status: Never married    Family History  Problem Relation Age of Onset  Diabetes Mother    Kidney disease Mother    Glaucoma Father    GER disease Father    Stomach cancer Paternal Uncle    Colon cancer Neg Hx    Colon polyps Neg Hx    Esophageal cancer Neg Hx    Rectal cancer Neg Hx     Health Maintenance  Topic Date Due   Zoster Vaccines- Shingrix (1 of 2) 08/31/2022 (Originally 08/05/2020)   INFLUENZA VACCINE  10/29/2022 (Originally 02/28/2022)   TETANUS/TDAP  02/11/2023 (Originally 02/29/2016)   Hepatitis C Screening  02/11/2023 (Originally 08/05/1988)   HIV Screening  02/11/2023 (Originally 08/05/1985)   COVID-19 Vaccine (1) 02/27/2023 (Originally 02[birth year]03/1971)   COLONOSCOPY (Pts 45-66yr Insurance coverage will need to be confirmed)  05/05/2022    HPV VACCINES  Aged Out     ----------------------------------------------------------------------------------------------------------------------------------------------------------------------------------------------------------------- Physical Exam BP 118/74 (BP Location: Left Arm, Patient Position: Sitting, Cuff Size: Large)   Pulse 97   Ht '5\' 10"'$  (1.778 m)   Wt 216 lb (98 kg)   SpO2 97%   BMI 30.99 kg/m   Physical Exam Constitutional:      Appearance: Normal appearance.  Cardiovascular:     Rate and Rhythm: Normal rate and regular rhythm.  Pulmonary:     Effort: Pulmonary effort is normal.     Breath sounds: Normal breath sounds.  Musculoskeletal:     Cervical back: Neck supple.  Neurological:     Mental Status: He is alert.     ------------------------------------------------------------------------------------------------------------------------------------------------------------------------------------------------------------------- Assessment and Plan  Globus sensation Continues to have glubus sensation with post nasal drainage.  Referral placed to allergist.  Continue current medications.  Recommend that he avoid smoking CBD.  May use sublingual drops if he would like to continue use of this.    No orders of the defined types were placed in this encounter.   No follow-ups on file.    This visit occurred during the SARS-CoV-2 public health emergency.  Safety protocols were in place, including screening questions prior to the visit, additional usage of staff PPE, and extensive cleaning of exam room while observing appropriate contact time as indicated for disinfecting solutions.

## 2022-03-28 NOTE — Assessment & Plan Note (Signed)
Continues to have glubus sensation with post nasal drainage.  Referral placed to allergist.  Continue current medications.  Recommend that he avoid smoking CBD.  May use sublingual drops if he would like to continue use of this.

## 2022-03-28 NOTE — Patient Instructions (Signed)
Stop smoking CBD You can use sublingual drop if you want to continue CBD. Referral placed to allergist

## 2022-04-12 ENCOUNTER — Telehealth: Payer: Self-pay

## 2022-04-12 ENCOUNTER — Other Ambulatory Visit: Payer: Self-pay

## 2022-04-12 DIAGNOSIS — D128 Benign neoplasm of rectum: Secondary | ICD-10-CM

## 2022-04-12 NOTE — Telephone Encounter (Signed)
Mail box full will mail instructions to the pt home address and send to My chart

## 2022-04-12 NOTE — Telephone Encounter (Signed)
EUS scheduled for 10/30 at 1130 am at [facility] with [facility]   Left message on machine to call back

## 2022-05-22 ENCOUNTER — Encounter (HOSPITAL_COMMUNITY): Payer: Self-pay | Admitting: Gastroenterology

## 2022-05-23 NOTE — Telephone Encounter (Signed)
Tried to reach pt by phone no answer and mail box full.

## 2022-05-29 ENCOUNTER — Ambulatory Visit (HOSPITAL_BASED_OUTPATIENT_CLINIC_OR_DEPARTMENT_OTHER): Payer: Commercial Managed Care - HMO | Admitting: Certified Registered"

## 2022-05-29 ENCOUNTER — Ambulatory Visit (HOSPITAL_COMMUNITY): Payer: Commercial Managed Care - HMO | Admitting: Certified Registered"

## 2022-05-29 ENCOUNTER — Other Ambulatory Visit: Payer: Self-pay

## 2022-05-29 ENCOUNTER — Ambulatory Visit (HOSPITAL_COMMUNITY)
Admission: RE | Admit: 2022-05-29 | Discharge: 2022-05-29 | Disposition: A | Discharge status: Home / Self Care | Payer: Commercial Managed Care - HMO | Source: Ambulatory Visit | Attending: Gastroenterology | Admitting: Gastroenterology

## 2022-05-29 ENCOUNTER — Encounter (HOSPITAL_COMMUNITY)
Admission: RE | Disposition: A | Discharge status: Home / Self Care | Payer: Self-pay | Source: Ambulatory Visit | Attending: Gastroenterology

## 2022-05-29 ENCOUNTER — Telehealth: Payer: Self-pay

## 2022-05-29 ENCOUNTER — Encounter (HOSPITAL_COMMUNITY): Payer: Self-pay | Admitting: Gastroenterology

## 2022-05-29 DIAGNOSIS — E669 Obesity, unspecified: Secondary | ICD-10-CM

## 2022-05-29 DIAGNOSIS — D128 Benign neoplasm of rectum: Secondary | ICD-10-CM

## 2022-05-29 DIAGNOSIS — Z86012 Personal history of benign carcinoid tumor: Secondary | ICD-10-CM | POA: Insufficient documentation

## 2022-05-29 DIAGNOSIS — R109 Unspecified abdominal pain: Secondary | ICD-10-CM

## 2022-05-29 DIAGNOSIS — K6289 Other specified diseases of anus and rectum: Secondary | ICD-10-CM

## 2022-05-29 DIAGNOSIS — K649 Unspecified hemorrhoids: Secondary | ICD-10-CM | POA: Diagnosis not present

## 2022-05-29 DIAGNOSIS — D127 Benign neoplasm of rectosigmoid junction: Secondary | ICD-10-CM | POA: Diagnosis not present

## 2022-05-29 DIAGNOSIS — H409 Unspecified glaucoma: Secondary | ICD-10-CM | POA: Diagnosis not present

## 2022-05-29 DIAGNOSIS — Z6831 Body mass index (BMI) 31.0-31.9, adult: Secondary | ICD-10-CM

## 2022-05-29 DIAGNOSIS — Z09 Encounter for follow-up examination after completed treatment for conditions other than malignant neoplasm: Secondary | ICD-10-CM | POA: Insufficient documentation

## 2022-05-29 DIAGNOSIS — K219 Gastro-esophageal reflux disease without esophagitis: Secondary | ICD-10-CM | POA: Insufficient documentation

## 2022-05-29 DIAGNOSIS — K621 Rectal polyp: Secondary | ICD-10-CM | POA: Insufficient documentation

## 2022-05-29 DIAGNOSIS — D3A8 Other benign neuroendocrine tumors: Secondary | ICD-10-CM | POA: Diagnosis present

## 2022-05-29 DIAGNOSIS — K6389 Other specified diseases of intestine: Secondary | ICD-10-CM | POA: Insufficient documentation

## 2022-05-29 DIAGNOSIS — I899 Noninfective disorder of lymphatic vessels and lymph nodes, unspecified: Secondary | ICD-10-CM | POA: Diagnosis not present

## 2022-05-29 HISTORY — PX: BIOPSY: SHX5522

## 2022-05-29 HISTORY — PX: FLEXIBLE SIGMOIDOSCOPY: SHX5431

## 2022-05-29 HISTORY — PX: EUS: SHX5427

## 2022-05-29 HISTORY — PX: POLYPECTOMY: SHX5525

## 2022-05-29 SURGERY — ULTRASOUND, LOWER GI TRACT, ENDOSCOPIC
Anesthesia: Monitor Anesthesia Care

## 2022-05-29 MED ORDER — LACTATED RINGERS IV SOLN: INTRAVENOUS | Status: DC | PRN

## 2022-05-29 MED ORDER — SODIUM CHLORIDE 0.9 % IV SOLN: INTRAVENOUS | Status: DC

## 2022-05-29 MED ORDER — PROPOFOL 1000 MG/100ML IV EMUL
INTRAVENOUS | Status: AC
  Filled 2022-05-29: qty 100

## 2022-05-29 MED ORDER — LACTATED RINGERS IV SOLN: INTRAVENOUS | Status: DC

## 2022-05-29 MED ORDER — PHENYLEPHRINE 80 MCG/ML (10ML) SYRINGE FOR IV PUSH (FOR BLOOD PRESSURE SUPPORT)
PREFILLED_SYRINGE | INTRAVENOUS | Status: DC | PRN
  Administered 2022-05-29 (×5): 80 ug via INTRAVENOUS

## 2022-05-29 MED ORDER — PROPOFOL 10 MG/ML IV BOLUS
INTRAVENOUS | Status: DC | PRN
  Administered 2022-05-29: 30 mg via INTRAVENOUS

## 2022-05-29 MED ORDER — LIDOCAINE 2% (20 MG/ML) 5 ML SYRINGE
INTRAMUSCULAR | Status: DC | PRN
  Administered 2022-05-29: 40 mg via INTRAVENOUS

## 2022-05-29 MED ORDER — PROPOFOL 500 MG/50ML IV EMUL
INTRAVENOUS | Status: DC | PRN
  Administered 2022-05-29: 150 ug/kg/min via INTRAVENOUS

## 2022-05-29 NOTE — H&P (Signed)
GASTROENTEROLOGY PROCEDURE H&P NOTE   Primary Care Physician: Luetta Nutting, DO  HPI: Marvin Lam is a 51 y.o. male who presents for lower EUS to follow-up previously resected neuroendocrine tumor.  Past Medical History:  Diagnosis Date   Glaucoma    UTI (lower urinary tract infection)    Past Surgical History:  Procedure Laterality Date   BIOPSY  05/05/2021   Procedure: BIOPSY;  Surgeon: Rush Landmark Telford Nab., MD;  Location: [place];  Service: Gastroenterology;;   COLONOSCOPY N/A 05/05/2021   Procedure: COLONOSCOPY;  Surgeon: Irving Copas., MD;  Location: [place];  Service: Gastroenterology;  Laterality: N/A;   EUS N/A 05/05/2021   Procedure: LOWER ENDOSCOPIC ULTRASOUND (EUS);  Surgeon: Irving Copas., MD;  Location: [place];  Service: Gastroenterology;  Laterality: N/A;   EYE SURGERY     HEMOSTASIS CLIP PLACEMENT  05/05/2021   Procedure: HEMOSTASIS CLIP PLACEMENT;  Surgeon: Rush Landmark Telford Nab., MD;  Location: [place];  Service: Gastroenterology;;   LASIK     Current Facility-Administered Medications  Medication Dose Route Frequency Provider Last Rate Last Admin   0.9 %  sodium chloride infusion   Intravenous Continuous Mansouraty, Telford Nab., MD       lactated ringers infusion   Intravenous Continuous Mansouraty, Telford Nab., MD 10 mL/hr at 05/29/22 1052 New Bag at 05/29/22 1052   Current Outpatient Medications  Medication Sig Dispense Refill   latanoprost (XALATAN) 0.005 % ophthalmic solution Place 1 drop into both eyes at bedtime.     azelastine (ASTELIN) 0.1 % nasal spray Place 2 sprays into both nostrils 2 (two) times daily. Use in each nostril as directed (Patient not taking: Reported on 05/22/2022) 30 mL 12   linaclotide (LINZESS) 290 MCG CAPS capsule Take 1 capsule (290 mcg total) by mouth daily. (Patient not taking: Reported on 05/22/2022) 90 capsule 0   pantoprazole (PROTONIX) 40 MG tablet Take 1 tablet (40 mg total) by  mouth daily. (Patient not taking: Reported on 05/22/2022) 30 tablet 3    Current Facility-Administered Medications:    0.9 %  sodium chloride infusion, , Intravenous, Continuous, Mansouraty, Telford Nab., MD   lactated ringers infusion, , Intravenous, Continuous, Mansouraty, Telford Nab., MD, Last Rate: 10 mL/hr at 05/29/22 1052, New Bag at 05/29/22 1052  Current Outpatient Medications:    latanoprost (XALATAN) 0.005 % ophthalmic solution, Place 1 drop into both eyes at bedtime., Disp: , Rfl:    azelastine (ASTELIN) 0.1 % nasal spray, Place 2 sprays into both nostrils 2 (two) times daily. Use in each nostril as directed (Patient not taking: Reported on 05/22/2022), Disp: 30 mL, Rfl: 12   linaclotide (LINZESS) 290 MCG CAPS capsule, Take 1 capsule (290 mcg total) by mouth daily. (Patient not taking: Reported on 05/22/2022), Disp: 90 capsule, Rfl: 0   pantoprazole (PROTONIX) 40 MG tablet, Take 1 tablet (40 mg total) by mouth daily. (Patient not taking: Reported on 05/22/2022), Disp: 30 tablet, Rfl: 3 No Known Allergies Family History  Problem Relation Age of Onset   Diabetes Mother    Kidney disease Mother    Glaucoma Father    GER disease Father    Stomach cancer Paternal Uncle    Colon cancer Neg Hx    Colon polyps Neg Hx    Esophageal cancer Neg Hx    Rectal cancer Neg Hx    Social History   Socioeconomic History   Marital status: Single    Spouse name: Not on file   Number of children:  Not on file   Years of education: Not on file   Highest education level: Not on file  Occupational History   Not on file  Tobacco Use   Smoking status: Former   Smokeless tobacco: Never  Vaping Use   Vaping Use: Never used  Substance and Sexual Activity   Alcohol use: No   Drug use: No   Sexual activity: Yes    Partners: Female    Birth control/protection: Condom  Other Topics Concern   Not on file  Social History Narrative   Not on file   Social Determinants of Health   Financial  Resource Strain: Low Risk  (02/11/2019)   Overall Financial Resource Strain (CARDIA)    Difficulty of Paying Living Expenses: Not hard at all  Food Insecurity: No Food Insecurity (02/11/2019)   Hunger Vital Sign    Worried About Running Out of Food in the Last Year: Never true    [place] in the Last Year: Never true  Transportation Needs: No Transportation Needs (02/11/2019)   PRAPARE - Hydrologist (Medical): No    Lack of Transportation (Non-Medical): No  Physical Activity: Not on file  Stress: Not on file  Social Connections: Unknown (02/11/2019)   Social Connection and Isolation Panel [NHANES]    Frequency of Communication with Friends and Family: More than three times a week    Frequency of Social Gatherings with Friends and Family: More than three times a week    Attends Religious Services: Not on file    Active Member of Clubs or Organizations: Not on file    Attends Archivist Meetings: Not on file    Marital Status: Never married  Intimate Partner Violence: Not At Risk (02/11/2019)   Humiliation, Afraid, Rape, and Kick questionnaire    Fear of Current or Ex-Partner: No    Emotionally Abused: No    Physically Abused: No    Sexually Abused: No    Physical Exam: Today's Vitals   05/29/22 1259 05/29/22 1306 05/29/22 1310 05/29/22 1320  BP: (!) 92/57 100/65 94/62 122/86  Pulse: 73 69 71 66  Resp: '16 15 14 '$ (!) 22  Temp: (!) 97.4 F (36.3 C)     TempSrc: Temporal     SpO2: 98% 100% 97% 99%  Weight:      Height:      PainSc: Asleep 0-No pain  0-No pain   Body mass index is 31.57 kg/m. GEN: NAD EYE: Sclerae anicteric ENT: MMM CV: Non-tachycardic GI: Soft, NT/ND NEURO:  Alert & Oriented x 3  Lab Results: No results for input(s): "WBC", "HGB", "HCT", "PLT" in the last 72 hours. BMET No results for input(s): "NA", "K", "CL", "CO2", "GLUCOSE", "BUN", "CREATININE", "CALCIUM" in the last 72 hours. LFT No results for input(s):  "PROT", "ALBUMIN", "AST", "ALT", "ALKPHOS", "BILITOT", "BILIDIR", "IBILI" in the last 72 hours. PT/INR No results for input(s): "LABPROT", "INR" in the last 72 hours.   Impression / Plan: This is a 51 y.o.male who presents for lower EUS to follow-up previously resected neuroendocrine tumor.  The risks and benefits of endoscopic evaluation/treatment were discussed with the patient and/or family; these include but are not limited to the risk of perforation, infection, bleeding, missed lesions, lack of diagnosis, severe illness requiring hospitalization, as well as anesthesia and sedation related illnesses.  The patient's history has been reviewed, patient examined, no change in status, and deemed stable for procedure.  The patient and/or family  is agreeable to proceed.    Justice Britain, MD [facility] Office # [phone]

## 2022-05-29 NOTE — Telephone Encounter (Signed)
The labs have been entered as ordered  The pt notified again as well via My Chart

## 2022-05-29 NOTE — Op Note (Signed)
[facility] Patient Name: Marvin Lam Procedure Date: 05/29/2022 MRN: [id number] Attending MD: Justice Britain , MD, [id number] Date of Birth: [birth year] CSN: [id number] Age: 51 Admit Type: Outpatient Procedure:                Lower EUS Indications:              Neuroendocrine Tumor (status post previous                            resection and negative EMR of scar in 2022) Providers:                Justice Britain, MD, Doristine Johns, RN, Cletis Athens, Technician Referring MD:             Carlota Raspberry. Havery Moros, MD Medicines:                Monitored Anesthesia Care Complications:            No immediate complications. Estimated Blood Loss:     Estimated blood loss was minimal. Procedure:                Pre-Anesthesia Assessment:                           - Prior to the procedure, a History and Physical                            was performed, and patient medications and                            allergies were reviewed. The patient's tolerance of                            previous anesthesia was also reviewed. The risks                            and benefits of the procedure and the sedation                            options and risks were discussed with the patient.                            All questions were answered, and informed consent                            was obtained. Prior Anticoagulants: The patient has                            taken no anticoagulant or antiplatelet agents. ASA                            Grade Assessment: II - A patient with mild systemic  disease. After reviewing the risks and benefits,                            the patient was deemed in satisfactory condition to                            undergo the procedure.                           After obtaining informed consent, the endoscope was                            passed under direct vision. Throughout the                             procedure, the patient's blood pressure, pulse, and                            oxygen saturations were monitored continuously. The                            [id number] ([id number]) Olympus therapeutic endoscope                            was introduced through the anus and advanced to the                            the descending colon. The [id number] ([id number])                            Olympus radial ultrasound scope was introduced                            through the anus and advanced to the the sigmoid                            colon for ultrasound. The lower EUS was                            accomplished without difficulty. The patient                            tolerated the procedure. The quality of the bowel                            preparation was adequate. Scope In: 12:30:23 PM Scope Out: 12:56:26 PM Total Procedure Duration: 0 hours 26 minutes 3 seconds  Findings:      ENDOSCOPIC FINDING: :      A large amount of semi-liquid stool was found in the entire visualized       left colon, interfering with visualization. Lavage of the area was       performed using copious amounts, resulting in clearance with adequate       visualization.      A 3 mm polyp was found  in the recto-sigmoid colon. The polyp was       sessile. The polyp was removed with a cold snare. Resection and       retrieval were complete.      A medium scar was found in the mid rectum. Adjacent mucosal findings       include 2 areas of nodularity?"most likely a result of previous healing       from EMR. These areas were removed with a cold snare for histology.      Normal mucosa was found in the visualized left colon otherwise.      The digital rectal exam findings include hemorrhoids. Pertinent       negatives include no palpable rectal lesions.      ENDOSONOGRAPHIC FINDING: :      Local wall thickening was found in the rectum at the area of the scar       site. This was encountered from  7 to 8 cm (from the anal verge). This       finding appeared to be primarily due to increased thickness of the deep       mucosa (Layer 2) and submucosa (Layer 3).      The perirectal space was otherwise normal.      No malignant-appearing lymph nodes were visualized in the perirectal       region and in the left iliac region.      The internal anal sphincter was visualized endosonographically and       appeared normal. Impression:               FLEX impression:                           - Stool in the entire examined colon.                           - One 3 mm polyp at the recto-sigmoid colon,                            removed with a cold snare. Resected and retrieved.                           - Scar in the mid rectum with 2 areas of                            nodularity. Nodularity was removed for histology                            purposes.                           - Normal mucosa in the visualized left colon                            otherwise.                           - Hemorrhoids found on digital rectal exam.  EUS impression:                           - Local wall thickening was visualized                            endosonographically in the rectum. This was due to                            increased thickness of the deep mucosa (Layer 2)                            and submucosa (Layer 3) as a result of the previous                            resection. No overt evidence of recurrence was                            found.                           - Endosonographic images of the perirectal space                            were unremarkable.                           - No malignant-appearing lymph nodes were                            visualized endosonographically in the perirectal                            region and in the left iliac region.                           - The internal anal sphincter was visualized                             endosonographically and appeared normal. Moderate Sedation:      Not Applicable - Patient had care per Anesthesia. Recommendation:           - The patient will be observed post-procedure,                            until all discharge criteria are met.                           - Discharge patient to home.                           - Patient has a contact number available for                            emergencies. The signs and symptoms of potential  delayed complications were discussed with the                            patient. Return to normal activities tomorrow.                            Written discharge instructions were provided to the                            patient.                           - High fiber diet.                           - Use FiberCon 1-2 tablets PO daily.                           - Await path results.                           - Repeat lower EUS in likely 2 years for follow-up.                           - The findings and recommendations were discussed                            with the patient. Procedure Code(s):        --- Professional ---                           [phone], Sigmoidoscopy, flexible; with endoscopic                            ultrasound examination                           [phone], Sigmoidoscopy, flexible; with removal of                            tumor(s), polyp(s), or other lesion(s) by snare                            technique Diagnosis Code(s):        --- Professional ---                           K64.9, Unspecified hemorrhoids                           D12.7, Benign neoplasm of rectosigmoid junction                           K62.89, Other specified diseases of anus and rectum                           I89.9, Noninfective disorder of lymphatic vessels  and lymph nodes, unspecified CPT copyright 2022 American Medical Association. All rights reserved. The codes documented in this  report are preliminary and upon coder review may  be revised to meet current compliance requirements. Justice Britain, MD 05/29/2022 1:16:20 PM Number of Addenda: 0

## 2022-05-29 NOTE — Transfer of Care (Signed)
Immediate Anesthesia Transfer of Care Note  Patient: Marvin Lam  Procedure(s) Performed: LOWER ENDOSCOPIC ULTRASOUND (EUS) POLYPECTOMY BIOPSY FLEXIBLE SIGMOIDOSCOPY  Patient Location: PACU and Endoscopy Unit  Anesthesia Type:MAC  Level of Consciousness: drowsy and responds to stimulation  Airway & Oxygen Therapy: Patient Spontanous Breathing and Patient connected to face mask oxygen  Post-op Assessment: Report given to RN and Post -op Vital signs reviewed and stable  Post vital signs: Reviewed and stable  Last Vitals:  Vitals Value Taken Time  BP 92/57 05/29/22 1300  Temp    Pulse 72 05/29/22 1300  Resp 16 05/29/22 1300  SpO2 99 % 05/29/22 1300  Vitals shown include unvalidated device data.  Last Pain:  Vitals:   05/29/22 1049  TempSrc: Oral  PainSc: 0-No pain         Complications: No notable events documented.

## 2022-05-29 NOTE — Anesthesia Postprocedure Evaluation (Signed)
Anesthesia Post Note  Patient: [name]  Procedure(s) Performed: LOWER ENDOSCOPIC ULTRASOUND (EUS) POLYPECTOMY BIOPSY FLEXIBLE SIGMOIDOSCOPY     Patient location during evaluation: Endoscopy Anesthesia Type: MAC Level of consciousness: awake and alert, patient cooperative and oriented Pain management: pain level controlled Vital Signs Assessment: post-procedure vital signs reviewed and stable Respiratory status: nonlabored ventilation, spontaneous breathing and respiratory function stable Cardiovascular status: blood pressure returned to baseline and stable Postop Assessment: no apparent nausea or vomiting and able to ambulate Anesthetic complications: no   No notable events documented.  Last Vitals:  Vitals:   05/29/22 1310 05/29/22 1320  BP: 94/62 122/86  Pulse: 71 66  Resp: 14 (!) 22  Temp:    SpO2: 97% 99%    Last Pain:  Vitals:   05/29/22 1320  TempSrc:   PainSc: 0-No pain                 Shawnte Demarest,E. Giorgia Wahler

## 2022-05-29 NOTE — Telephone Encounter (Signed)
-----   Message from Irving Copas., MD sent at 05/29/2022  1:23 PM EDT ----- Regarding: Follow-up Marvin Lam, This patient completed his EUS we will see what those results show and then decide what follow-up will be. Patient described to me that every time he eats pizza or pasta or noodles or bread he describes a lower abdominal cramping and discomfort. Please set up TTG IgA and IgA level to be checked to rule out celiac disease and you can put that under Dr. Doyne Keel name as he is his primary. Thanks. GM

## 2022-05-29 NOTE — Discharge Instructions (Signed)

## 2022-05-29 NOTE — Anesthesia Preprocedure Evaluation (Addendum)
Anesthesia Evaluation  Patient identified by MRN, date of birth, ID band Patient awake    Reviewed: Allergy & Precautions, NPO status , Patient's Chart, lab work & pertinent test results  History of Anesthesia Complications Negative for: history of anesthetic complications  Airway Mallampati: II  TM Distance: >3 FB Neck ROM: Full    Dental  (+) Missing, Dental Advisory Given   Pulmonary Current SmokerPatient did not abstain from smoking.,    breath sounds clear to auscultation       Cardiovascular negative cardio ROS   Rhythm:Regular Rate:Normal     Neuro/Psych glaucoma    GI/Hepatic GERD  Medicated and Controlled,(+)     substance abuse  marijuana use,   Endo/Other  BMI 31.6  Renal/GU negative Renal ROS     Musculoskeletal   Abdominal (+) + obese,   Peds  Hematology   Anesthesia Other Findings   Reproductive/Obstetrics                            Anesthesia Physical Anesthesia Plan  ASA: 2  Anesthesia Plan: MAC   Post-op Pain Management: Minimal or no pain anticipated   Induction:   PONV Risk Score and Plan: 1 and Treatment may vary due to age or medical condition  Airway Management Planned: Natural Airway and Simple Face Mask  Additional Equipment: None  Intra-op Plan:   Post-operative Plan:   Informed Consent: I have reviewed the patients History and Physical, chart, labs and discussed the procedure including the risks, benefits and alternatives for the proposed anesthesia with the patient or authorized representative who has indicated his/her understanding and acceptance.     Dental advisory given  Plan Discussed with: CRNA and Surgeon  Anesthesia Plan Comments:        Anesthesia Quick Evaluation

## 2022-05-29 NOTE — Anesthesia Procedure Notes (Signed)
Procedure Name: MAC Date/Time: 05/29/2022 12:22 PM  Performed by: Eben Burow, CRNAPre-anesthesia Checklist: Patient identified, Emergency Drugs available, Suction available, Patient being monitored and Timeout performed Oxygen Delivery Method: Simple face mask Placement Confirmation: positive ETCO2

## 2022-05-30 LAB — SURGICAL PATHOLOGY

## 2022-05-31 ENCOUNTER — Encounter (HOSPITAL_COMMUNITY): Payer: Self-pay | Admitting: Gastroenterology

## 2022-06-01 ENCOUNTER — Encounter: Payer: Self-pay | Admitting: Gastroenterology

## 2022-06-07 ENCOUNTER — Encounter: Payer: Self-pay | Admitting: Allergy

## 2022-06-07 ENCOUNTER — Ambulatory Visit (INDEPENDENT_AMBULATORY_CARE_PROVIDER_SITE_OTHER): Payer: Managed Care, Other (non HMO) | Admitting: Allergy

## 2022-06-07 VITALS — BP 102/72 | HR 87 | Temp 97.4°F | Resp 18 | Ht 69.4 in | Wt 209.8 lb

## 2022-06-07 DIAGNOSIS — J300 Vasomotor rhinitis: Secondary | ICD-10-CM | POA: Diagnosis not present

## 2022-06-07 DIAGNOSIS — J3089 Other allergic rhinitis: Secondary | ICD-10-CM | POA: Diagnosis not present

## 2022-06-07 MED ORDER — IPRATROPIUM BROMIDE 0.06 % NA SOLN: NASAL | 5 refills | Status: AC

## 2022-06-07 NOTE — Patient Instructions (Signed)
Allergic rhinitis Vasomotor rhinitis--> nasal symptoms after eating - Testing today showed: trees and outdoor molds. - Copy of test results provided.  - Avoidance measures provided. - Can take: Xyzal (levocetirizine) '5mg'$  tablet once daily as needed.  This is an antihistamine.   - You can use an extra dose of the antihistamine, if needed, for breakthrough symptoms.  - Recommend use of nasal Atrovent 0.06% 2 sprays each nostril prior to eating and as needed for nasal drainage control  Follow-up in 6 months or sooner if needed

## 2022-06-07 NOTE — Progress Notes (Signed)
New Patient Note  RE: Marvin Lam MRN: [id number] DOB: Nov 06, 1970 Date of Office Visit: 06/07/2022   Primary care provider: Luetta Nutting, DO  Chief Complaint: runny nose with eating  History of present illness: Marvin Lam is a 51 y.o. male presenting today for evaluation of rhinorrhea due to foods.   He reports having post nasal drip and states it was quite bad this past year. States worsening during this fall however there is no seasonal predilection for it.   He states after eating foods he can have runny nose at times but does not happen every time he eats and does not occur with any specific food.   He use to take an antihistamine but states did not help much.  He states has used Flonase but also not sure if it made a difference. Does not report significant ocular symptoms or sneezing symptoms or congestion. No history of asthma, eczema or food allergy.    Review of systems: Review of Systems  Constitutional: Negative.   HENT:  Positive for rhinorrhea.   Eyes: Negative.   Respiratory: Negative.    Cardiovascular: Negative.   Musculoskeletal: Negative.   Skin: Negative.   Allergic/Immunologic: Negative.   Neurological: Negative.     All other systems negative unless noted above in HPI  Past medical history: Past Medical History:  Diagnosis Date   Glaucoma    UTI (lower urinary tract infection)     Past surgical history: Past Surgical History:  Procedure Laterality Date   BIOPSY  05/05/2021   Procedure: BIOPSY;  Surgeon: Rush Landmark, Telford Nab., MD;  Location: [facility];  Service: Gastroenterology;;   BIOPSY  05/29/2022   Procedure: BIOPSY;  Surgeon: Irving Copas., MD;  Location: [facility];  Service: Gastroenterology;;   COLONOSCOPY N/A 05/05/2021   Procedure: COLONOSCOPY;  Surgeon: Irving Copas., MD;  Location: [facility] ENDOSCOPY;  Service: Gastroenterology;  Laterality: N/A;   EUS N/A 05/05/2021   Procedure: LOWER ENDOSCOPIC  ULTRASOUND (EUS);  Surgeon: Irving Copas., MD;  Location: [facility];  Service: Gastroenterology;  Laterality: N/A;   EUS N/A 05/29/2022   Procedure: LOWER ENDOSCOPIC ULTRASOUND (EUS);  Surgeon: Irving Copas., MD;  Location: [facility];  Service: Gastroenterology;  Laterality: N/A;   EYE SURGERY     FLEXIBLE SIGMOIDOSCOPY N/A 05/29/2022   Procedure: FLEXIBLE SIGMOIDOSCOPY;  Surgeon: Rush Landmark Telford Nab., MD;  Location: [facility] ENDOSCOPY;  Service: Gastroenterology;  Laterality: N/A;   HEMOSTASIS CLIP PLACEMENT  05/05/2021   Procedure: HEMOSTASIS CLIP PLACEMENT;  Surgeon: Rush Landmark Telford Nab., MD;  Location: [facility] ENDOSCOPY;  Service: Gastroenterology;;   LASIK     POLYPECTOMY  05/29/2022   Procedure: POLYPECTOMY;  Surgeon: Irving Copas., MD;  Location: [facility];  Service: Gastroenterology;;    Family history:  Family History  Problem Relation Age of Onset   Diabetes Mother    Kidney disease Mother    Glaucoma Father    GER disease Father    Stomach cancer Paternal Uncle    Colon cancer Neg Hx    Colon polyps Neg Hx    Esophageal cancer Neg Hx    Rectal cancer Neg Hx     Social history: Lives in a apartment with carpeting with gas heating and central cooling.  Patient in the home.  There is no concern for water damage, mildew or roaches in the home.  He is a Animator.  Does not report a smoking history.   Medication List: Current Outpatient Medications  Medication  Sig Dispense Refill   latanoprost (XALATAN) 0.005 % ophthalmic solution Place 1 drop into both eyes at bedtime.     No current facility-administered medications for this visit.    Known medication allergies: No Known Allergies   Physical examination: Blood pressure 102/72, pulse 87, temperature (!) 97.4 F (36.3 C), temperature source Temporal, resp. rate 18, height 5' 9.4" (1.763 m), weight 209 lb 12.8 oz (95.2 kg), SpO2 98 %.  General: Alert, interactive, in no acute  distress. HEENT: PERRLA, TMs pearly gray, turbinates non-edematous without discharge, post-pharynx non erythematous. Neck: Supple without lymphadenopathy. Lungs: Clear to auscultation without wheezing, rhonchi or rales. {no increased work of breathing. CV: Normal S1, S2 without murmurs. Abdomen: Nondistended, nontender. Skin: Warm and dry, without lesions or rashes. Extremities:  No clubbing, cyanosis or edema. Neuro:   Grossly intact.  Diagnositics/Labs:  Allergy testing:   Airborne Adult Perc - 06/07/22 1532     Time Antigen Placed 1530    Allergen Manufacturer Lavella Hammock    Location Back    Number of Test 59    1. Control-Buffer 50% Glycerol Negative    2. Control-Histamine 1 mg/ml 2+    3. Albumin saline Negative    4. Navarino Negative    5. Guatemala Negative    6. Johnson Negative    7. Jenison Blue Negative    8. Meadow Fescue Negative    9. Perennial Rye Negative    10. Sweet Vernal Negative    11. Timothy Negative    12. Cocklebur Negative    13. Burweed Marshelder Negative    14. Ragweed, short Negative    15. Ragweed, Giant Negative    16. Plantain,  English Negative    17. Lamb's Quarters Negative    18. Sheep Sorrell Negative    19. Rough Pigweed Negative    20. Marsh Elder, Rough Negative    21. Mugwort, Common Negative    22. Ash mix Negative    23. Birch mix Negative    24. Beech American Negative    25. Box, Elder Negative    26. Cedar, red Negative    27. Cottonwood, [place] Negative    28. Elm mix Negative    29. Hickory Negative    30. Maple mix Negative    31. Oak, [place] mix Negative    32. Pecan Pollen Negative    33. Pine mix Negative    34. Sycamore Eastern Negative    35. Brandon, Black Pollen Negative    36. Alternaria alternata Negative    37. Cladosporium Herbarum Negative    38. Aspergillus mix Negative    39. Penicillium mix Negative    40. Bipolaris sorokiniana (Helminthosporium) Negative    41. Drechslera spicifera (Curvularia)  Negative    42. Mucor plumbeus Negative    43. Fusarium moniliforme Negative    44. Aureobasidium pullulans (pullulara) Negative    45. Rhizopus oryzae Negative    46. Botrytis cinera Negative    47. Epicoccum nigrum Negative    48. Phoma betae 2+    50. Trichophyton mentagrophytes 2+    51. Mite, D Farinae  5,000 AU/ml Negative    52. Mite, D Pteronyssinus  5,000 AU/ml Negative    53. Cat Hair 10,000 BAU/ml Negative    54.  Dog Epithelia Negative    55. Mixed Feathers Negative    56. Horse Epithelia Negative    57. Cockroach, German Negative    58. Mouse Negative    59. Tobacco  Leaf Negative             Allergy testing results were read and interpreted by provider, documented by clinical staff.   Assessment and plan: Allergic rhinitis Vasomotor rhinitis--> nasal symptoms after eating - Testing today showed: trees and outdoor molds. - Copy of test results provided.  - Avoidance measures provided. - Can take: Xyzal (levocetirizine) '5mg'$  tablet once daily as needed.  This is an antihistamine.   - You can use an extra dose of the antihistamine, if needed, for breakthrough symptoms.  - Recommend use of nasal Atrovent 0.06% 2 sprays each nostril prior to eating and as needed for nasal drainage control  Follow-up in 6 months or sooner if needed  I appreciate the opportunity to take part in Marvin Lam's care. Please do not hesitate to contact me with questions.  Sincerely,   Prudy Feeler, MD Allergy/Immunology [facility] [place] [facility] [place]

## 2022-08-31 IMAGING — CT CT ABD-PELV W/ CM
2 of 5 series · 16 of 46 positions shown, 18 images · IV contrast (APPLIED)
Comparison: None.

CLINICAL DATA: Left lower quadrant discomfort

EXAM:
CT ABDOMEN AND PELVIS WITH CONTRAST
TECHNIQUE: Multidetector CT imaging of the abdomen and pelvis was performed
using the standard protocol following bolus administration of
intravenous contrast.
CONTRAST:  100mL OMNIPAQUE IOHEXOL 300 MG/ML  SOLN

[Series 2: axial st · axial · 0.76mm/px · z∈[-476,-31]mm · 13 of 101 slices shown, 15 images]
[im 6/101  soft-tissue]
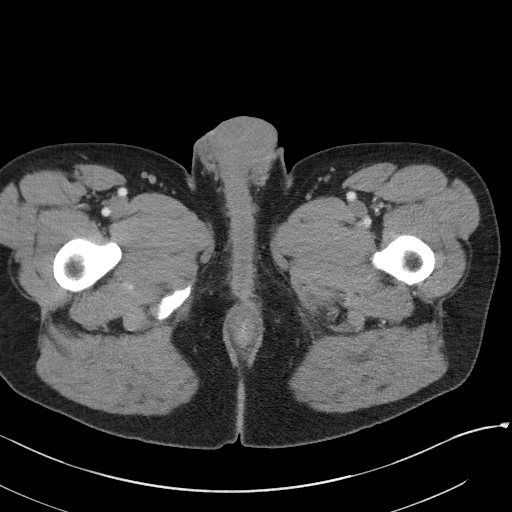
[im 6/101  bone]
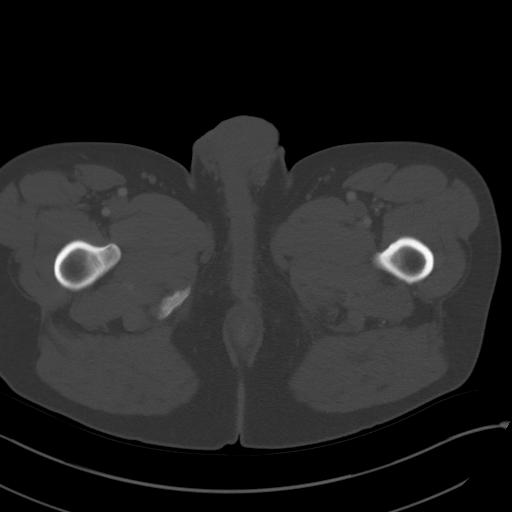
[im 16/101  soft-tissue]
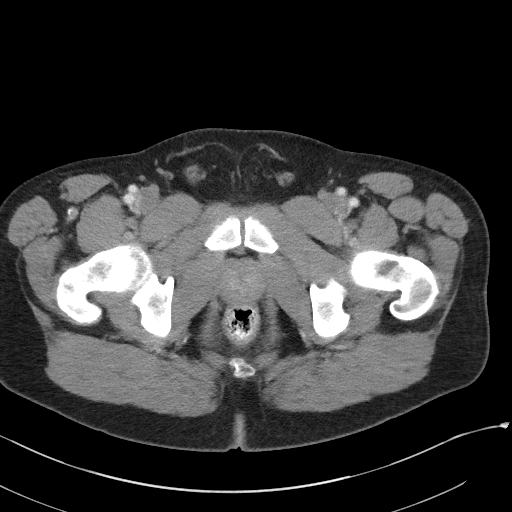
[im 22/101  soft-tissue]
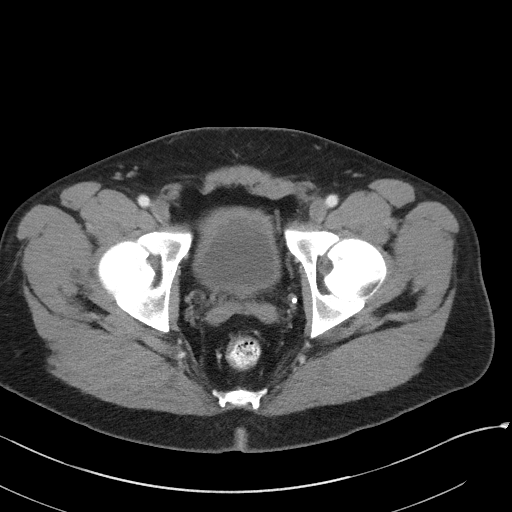
[im 27/101  soft-tissue]
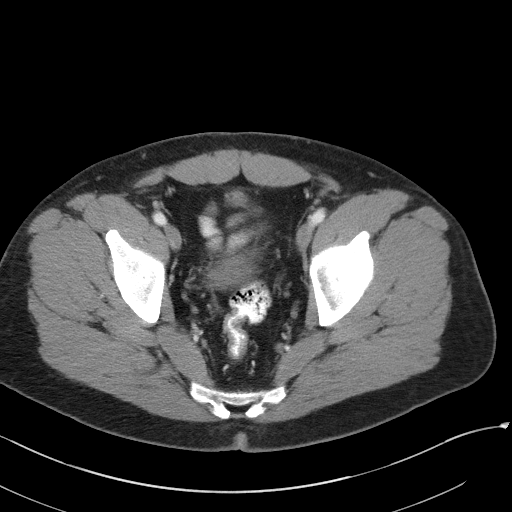
[im 37/101  soft-tissue]
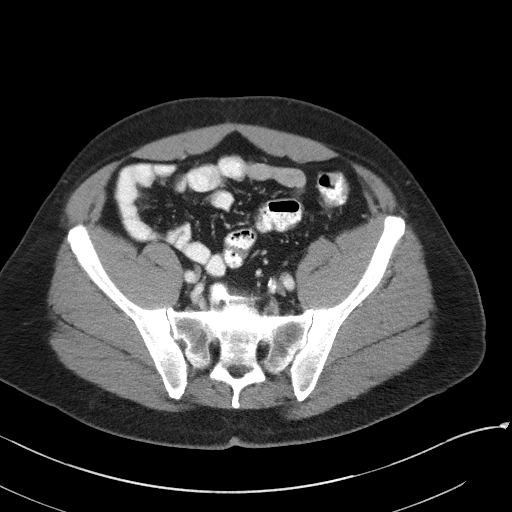
[im 43/101  soft-tissue]
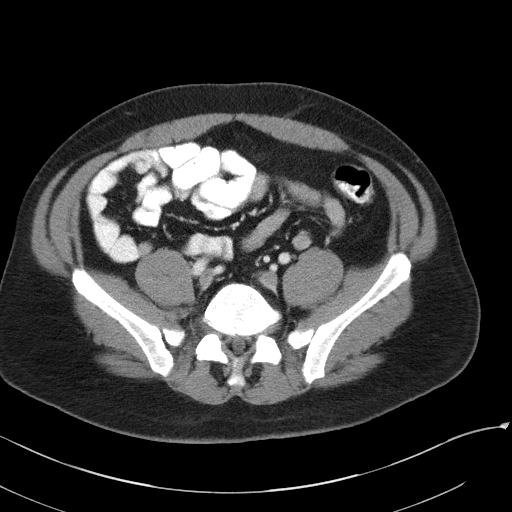
[im 53/101  soft-tissue]
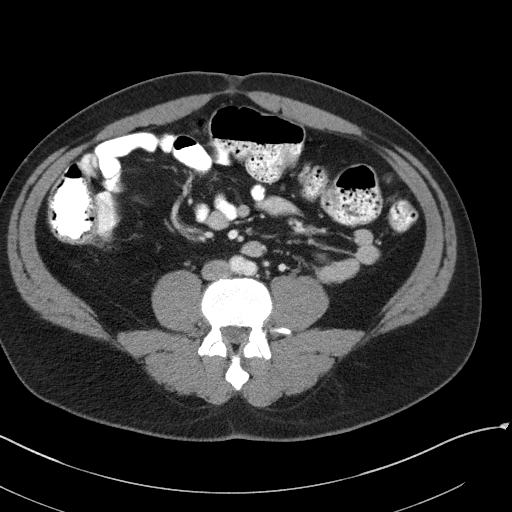
[im 58/101  soft-tissue]
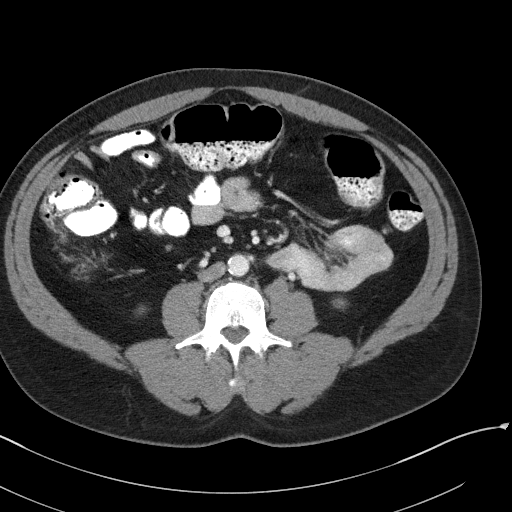
[im 64/101  soft-tissue]
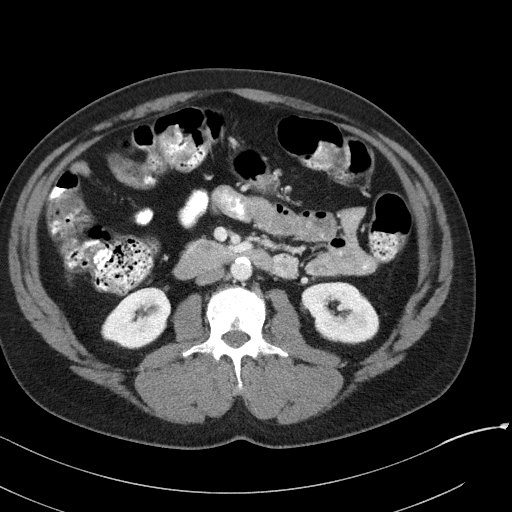
[im 64/101  bone]
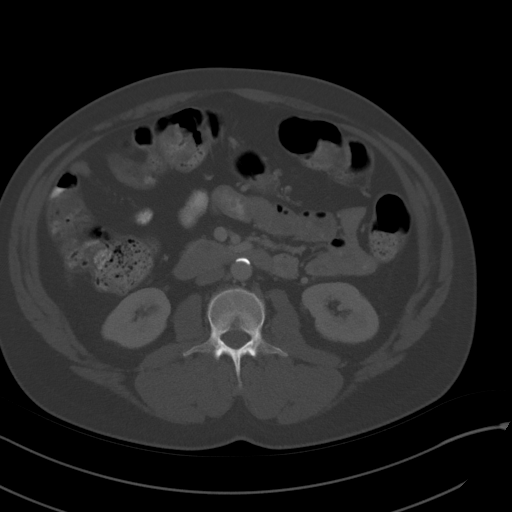
[im 74/101  soft-tissue]
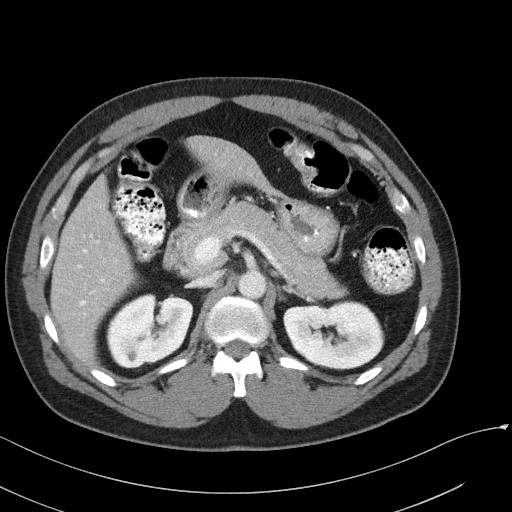
[im 79/101  soft-tissue]
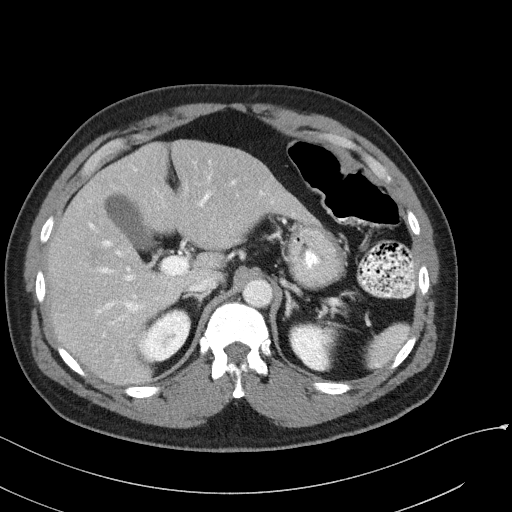
[im 85/101  soft-tissue]
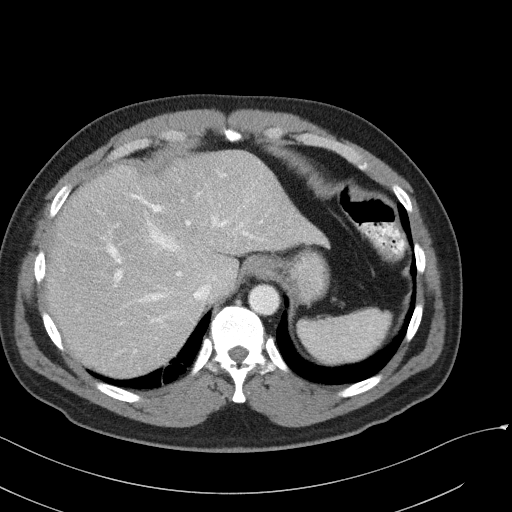
[im 95/101  soft-tissue]
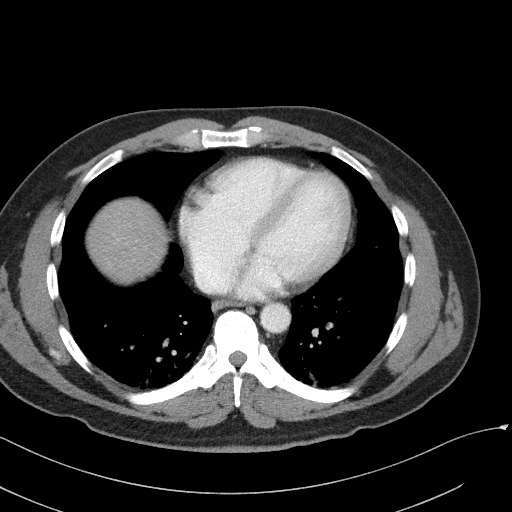

[Series 5: coronal st · coronal · 0.72mm/px · 3 of 91 slices shown]
[im 31/91  soft-tissue]
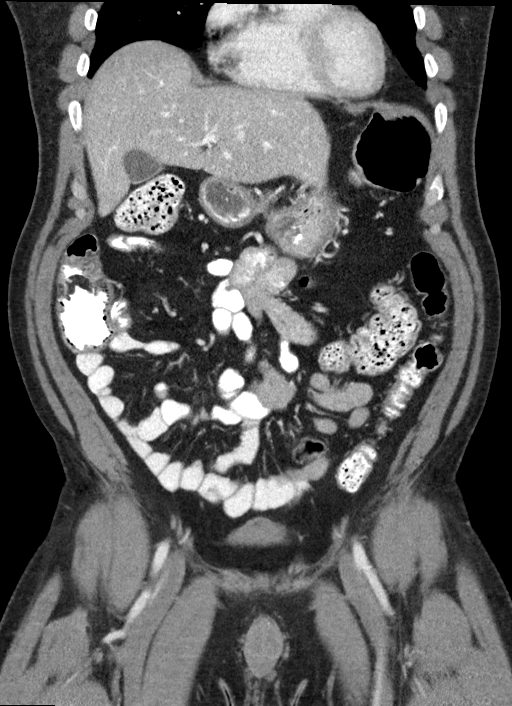
[im 41/91  soft-tissue]
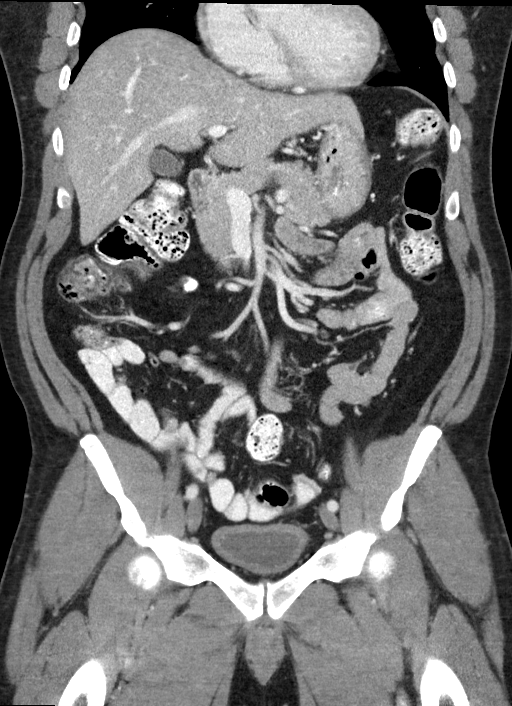
[im 51/91  soft-tissue]
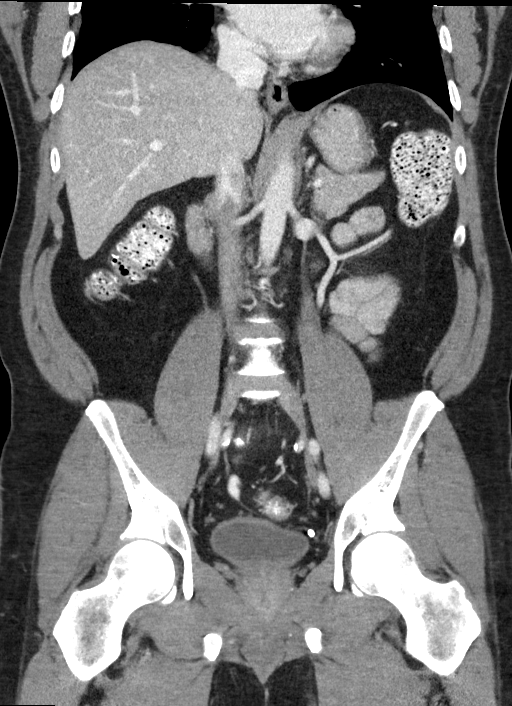

[16 of 46 positions shown; findings below may reference images not displayed]

FINDINGS: Lower chest: Bibasilar dependent atelectasis or scarring. No
effusions.

Hepatobiliary: No focal hepatic abnormality. Gallbladder
unremarkable.

Pancreas: No focal abnormality or ductal dilatation.

Spleen: No focal abnormality.  Normal size.

Adrenals/Urinary Tract: Small hypodensities in the right kidney,
likely cysts. No stones or hydronephrosis. Adrenal glands and
urinary bladder unremarkable.

Stomach/Bowel: Normal appendix. Moderate stool burden throughout the
colon. Stomach, large and small bowel grossly unremarkable.

Vascular/Lymphatic: Aortic atherosclerosis. No evidence of aneurysm
or adenopathy.

Reproductive: No visible focal abnormality.

Other: No free fluid or free air.

Musculoskeletal: No acute bony abnormality.
IMPRESSION: Moderate stool burden in the colon.

Aortic atherosclerosis.

No acute findings.

## 2022-12-06 ENCOUNTER — Ambulatory Visit: Payer: Medicaid Other | Admitting: Allergy

## 2023-12-24 ENCOUNTER — Other Ambulatory Visit: Payer: Self-pay

## 2023-12-24 ENCOUNTER — Emergency Department (HOSPITAL_COMMUNITY)
Admission: EM | Admit: 2023-12-24 | Discharge: 2023-12-24 | Disposition: A | Discharge status: Home / Self Care | Attending: Emergency Medicine | Admitting: Emergency Medicine

## 2023-12-24 DIAGNOSIS — R300 Dysuria: Secondary | ICD-10-CM | POA: Diagnosis present

## 2023-12-24 DIAGNOSIS — N390 Urinary tract infection, site not specified: Secondary | ICD-10-CM

## 2023-12-24 LAB — URINALYSIS, ROUTINE W REFLEX MICROSCOPIC
Bacteria, UA: NONE SEEN
Bilirubin Urine: NEGATIVE
Glucose, UA: NEGATIVE mg/dL
Hgb urine dipstick: NEGATIVE
Ketones, ur: NEGATIVE mg/dL
Nitrite: NEGATIVE
Protein, ur: NEGATIVE mg/dL
Specific Gravity, Urine: 1.015 (ref 1.005–1.030)
pH: 5 (ref 5.0–8.0)

## 2023-12-24 MED ORDER — CEPHALEXIN 500 MG PO CAPS: 500.0000 mg | ORAL_CAPSULE | Freq: Four times a day (QID) | ORAL | 0 refills | Status: AC

## 2023-12-24 NOTE — ED Provider Notes (Signed)
 [place] EMERGENCY DEPARTMENT AT [facility] Provider Note   CSN: [id number] Arrival date & time: 12/24/23  0703     History  Chief Complaint  Patient presents with   Dysuria    Marvin Lam is a 53 y.o. male.  53 year old male presents with dysuria x 3 weeks.  Denies any fever or flank pain.  States has had UTIs for the past but denies any history of prostatitis.  Denies any penile drainage or discharge.  Symptoms been persistent with treatment use prior to arrival       Home Medications Prior to Admission medications   Medication Sig Start Date End Date Taking? Authorizing Provider  ipratropium (ATROVENT ) 0.06 % nasal spray 2 sprays prior to meals or as needed for nasal drip.  May use up to 4 times a day. 06/07/22   Brian Campanile, MD  latanoprost (XALATAN) 0.005 % ophthalmic solution Place 1 drop into both eyes at bedtime. 01/10/20   [provider]      Allergies    Patient has no known allergies.    Review of Systems   Review of Systems  All other systems reviewed and are negative.   Physical Exam Updated Vital Signs BP (!) 125/93 (BP Location: Left Arm)   Pulse 82   Temp 98 F (36.7 C) (Oral)   Resp 16   Ht 1.803 m (5\' 11" )   Wt 95.3 kg   SpO2 100%   BMI 29.29 kg/m  Physical Exam Vitals and nursing note reviewed.  Constitutional:      General: He is not in acute distress.    Appearance: Normal appearance. He is well-developed. He is not toxic-appearing.  HENT:     Head: Normocephalic and atraumatic.  Eyes:     General: Lids are normal.     Conjunctiva/sclera: Conjunctivae normal.     Pupils: Pupils are equal, round, and reactive to light.  Neck:     Thyroid: No thyroid mass.     Trachea: No tracheal deviation.  Cardiovascular:     Rate and Rhythm: Normal rate and regular rhythm.     Heart sounds: Normal heart sounds. No murmur heard.    No gallop.  Pulmonary:     Effort: Pulmonary effort is normal. No  respiratory distress.     Breath sounds: Normal breath sounds. No stridor. No decreased breath sounds, wheezing, rhonchi or rales.  Abdominal:     General: There is no distension.     Palpations: Abdomen is soft.     Tenderness: There is no abdominal tenderness. There is no rebound.  Musculoskeletal:        General: No tenderness. Normal range of motion.     Cervical back: Normal range of motion and neck supple.  Skin:    General: Skin is warm and dry.     Findings: No abrasion or rash.  Neurological:     Mental Status: He is alert and oriented to person, place, and time. Mental status is at baseline.     GCS: GCS eye subscore is 4. GCS verbal subscore is 5. GCS motor subscore is 6.     Cranial Nerves: No cranial nerve deficit.     Sensory: No sensory deficit.     Motor: Motor function is intact.  Psychiatric:        Attention and Perception: Attention normal.        Speech: Speech normal.        Behavior: Behavior normal.  ED Results / Procedures / Treatments   Labs (all labs ordered are listed, but only abnormal results are displayed) Labs Reviewed  URINALYSIS, ROUTINE W REFLEX MICROSCOPIC    EKG None  Radiology No results found.  Procedures Procedures    Medications Ordered in ED Medications - No data to display  ED Course/ Medical Decision Making/ A&P                                 Medical Decision Making Amount and/or Complexity of Data Reviewed Labs: ordered.   Patient's urinalysis consistent with infection.  Will be started on Keflex  and given referral to urology        Final Clinical Impression(s) / ED Diagnoses Final diagnoses:  None    Rx / DC Orders ED Discharge Orders     None         Lind Repine, MD 12/24/23 [phone]

## 2023-12-24 NOTE — ED Triage Notes (Signed)
 Pt reports dysuria x3 weeks. Denies hematuria, abd or back pain, chills

## 2024-01-07 ENCOUNTER — Ambulatory Visit: Payer: Self-pay

## 2024-01-07 NOTE — Telephone Encounter (Signed)
 FYI Only or Action Required?: FYI only for provider  Patient was last seen in primary care on 03/28/2022 by Adela Holter, DO. Called Nurse Triage reporting Groin Swelling. Symptoms began several weeks ago. Interventions attempted: Nothing. Symptoms are: stable.  Triage Disposition: See Physician Within 24 Hours  Patient/caregiver understands and will follow disposition?: Yes          Summary: right testicle discomfort   Copied From CRM [id number]. Reason for Triage: right testicle discomfort (off and on) for about two weeks.         Reason for Disposition  [1] Pain comes and goes (intermittent) AND [2] present > 24 hours  Answer Assessment - Initial Assessment Questions 1. LOCATION and RADIATION: "Where is the pain located?"      Right testicle 2. QUALITY: "What does the pain feel like?"  (e.g., sharp, dull, aching, burning)     Dull aching 3. SEVERITY: "How bad is the pain?"  (Scale 1-10; or mild, moderate, severe)   - MILD (1-3): doesn't interfere with normal activities    - MODERATE (4-7): interferes with normal activities (e.g., work or school) or awakens from sleep   - SEVERE (8-10): excruciating pain, unable to do any normal activities, difficulty walking     5/10 4. ONSET: "When did the pain start?"     X 2 weeks 5. PATTERN: "Does it come and go, or has it been constant since it started?"     Intermittent 6. SCROTAL APPEARANCE: "What does the scrotum look like?" "Is there any swelling or redness?"      No swelling, rash, redness 7. HERNIA: "Has a doctor ever told you that you have a hernia?"     None 8. OTHER SYMPTOMS: "Do you have any other symptoms?" (e.g., abdomen pain, difficulty passing urine, fever, vomiting)     None    Pt in [place]  for work and will proceed to UC for evaluation.  Protocols used: Scrotum Pain-A-AH

## 2024-01-17 ENCOUNTER — Ambulatory Visit (INDEPENDENT_AMBULATORY_CARE_PROVIDER_SITE_OTHER): Admitting: Family Medicine

## 2024-01-17 ENCOUNTER — Encounter: Payer: Self-pay | Admitting: Family Medicine

## 2024-01-17 VITALS — BP 110/72 | HR 87 | Ht 71.0 in | Wt 217.0 lb

## 2024-01-17 DIAGNOSIS — R300 Dysuria: Secondary | ICD-10-CM | POA: Diagnosis not present

## 2024-01-17 DIAGNOSIS — N50819 Testicular pain, unspecified: Secondary | ICD-10-CM | POA: Diagnosis not present

## 2024-01-17 LAB — POCT URINALYSIS DIP (CLINITEK)
Bilirubin, UA: NEGATIVE
Blood, UA: NEGATIVE
Glucose, UA: NEGATIVE mg/dL
Ketones, POC UA: NEGATIVE mg/dL
Leukocytes, UA: NEGATIVE
Nitrite, UA: NEGATIVE
POC PROTEIN,UA: NEGATIVE
Spec Grav, UA: 1.015 (ref 1.010–1.025)
Urobilinogen, UA: 1 U/dL
pH, UA: 7.5 (ref 5.0–8.0)

## 2024-01-17 MED ORDER — DOXYCYCLINE HYCLATE 100 MG PO TABS: 100.0000 mg | ORAL_TABLET | Freq: Two times a day (BID) | ORAL | 0 refills | Status: AC

## 2024-01-17 NOTE — Assessment & Plan Note (Signed)
 UA normal.  Urine sent for culture including ureaplasma and gc/chlamydia.  Empiric coverage with doxycycline .

## 2024-01-17 NOTE — Progress Notes (Signed)
 Marvin Lam - 53 y.o. male MRN [id number]  Date of birth: 1971/06/13  Subjective Chief Complaint  Patient presents with   Urinary Tract Infection   Testicle Pain    HPI Marvin Lam is a 53 y.o. male here today for follow up of recent urgent care visit.  Seen on 01/09/24 with complaint of testicular pain.  UA concerning for UTI and was initially rx cephalexin  x7 days at ED visit in May.  At [facility] visit had repeat UA with leukocytes and culture without growth.  Was not tested for STI at either visit.  Denies blood in the urine, fever, chills or flank pain.   ROS:  A comprehensive ROS was completed and negative except as noted per HPI   No Known Allergies  Past Medical History:  Diagnosis Date   Glaucoma    UTI (lower urinary tract infection)     Past Surgical History:  Procedure Laterality Date   BIOPSY  05/05/2021   Procedure: BIOPSY;  Surgeon: Brice Campi Albino Alu., MD;  Location: [facility] ENDOSCOPY;  Service: Gastroenterology;;   BIOPSY  05/29/2022   Procedure: BIOPSY;  Surgeon: Normie Becton., MD;  Location: [facility];  Service: Gastroenterology;;   COLONOSCOPY N/A 05/05/2021   Procedure: COLONOSCOPY;  Surgeon: Normie Becton., MD;  Location: [facility] ENDOSCOPY;  Service: Gastroenterology;  Laterality: N/A;   EUS N/A 05/05/2021   Procedure: LOWER ENDOSCOPIC ULTRASOUND (EUS);  Surgeon: Normie Becton., MD;  Location: [facility] ENDOSCOPY;  Service: Gastroenterology;  Laterality: N/A;   EUS N/A 05/29/2022   Procedure: LOWER ENDOSCOPIC ULTRASOUND (EUS);  Surgeon: Normie Becton., MD;  Location: [facility];  Service: Gastroenterology;  Laterality: N/A;   EYE SURGERY     FLEXIBLE SIGMOIDOSCOPY N/A 05/29/2022   Procedure: FLEXIBLE SIGMOIDOSCOPY;  Surgeon: Brice Campi Albino Alu., MD;  Location: [facility] ENDOSCOPY;  Service: Gastroenterology;  Laterality: N/A;   HEMOSTASIS CLIP PLACEMENT  05/05/2021   Procedure: HEMOSTASIS CLIP PLACEMENT;  Surgeon: Normie Becton., MD;  Location: [facility] ENDOSCOPY;  Service: Gastroenterology;;   LASIK     POLYPECTOMY  05/29/2022   Procedure: POLYPECTOMY;  Surgeon: Normie Becton., MD;  Location: [facility];  Service: Gastroenterology;;    Social History   Socioeconomic History   Marital status: Single    Spouse name: Not on file   Number of children: Not on file   Years of education: Not on file   Highest education level: Not on file  Occupational History   Not on file  Tobacco Use   Smoking status: Former   Smokeless tobacco: Never  Vaping Use   Vaping status: Never Used  Substance and Sexual Activity   Alcohol use: No   Drug use: No   Sexual activity: Yes    Partners: Female    Birth control/protection: Condom  Other Topics Concern   Not on file  Social History Narrative   Not on file   Social Drivers of Health   Financial Resource Strain: Low Risk  (02/11/2019)   Overall Financial Resource Strain (CARDIA)    Difficulty of Paying Living Expenses: Not hard at all  Food Insecurity: No Food Insecurity (02/11/2019)   Hunger Vital Sign    Worried About Running Out of Food in the Last Year: Never true    Ran Out of Food in the Last Year: Never true  Transportation Needs: No Transportation Needs (02/11/2019)   PRAPARE - Administrator, Civil Service (Medical): No    Lack of Transportation (Non-Medical):  No  Physical Activity: Not on file  Stress: Not on file  Social Connections: Unknown (02/11/2019)   Social Connection and Isolation Panel    Frequency of Communication with Friends and Family: More than three times a week    Frequency of Social Gatherings with Friends and Family: More than three times a week    Attends Religious Services: Not on Marketing executive or Organizations: Not on file    Attends Banker Meetings: Not on file    Marital Status: Never married    Family History  Problem Relation Age of Onset   Diabetes Mother    Kidney disease  Mother    Glaucoma Father    GER disease Father    Stomach cancer Paternal Uncle    Colon cancer Neg Hx    Colon polyps Neg Hx    Esophageal cancer Neg Hx    Rectal cancer Neg Hx     Health Maintenance  Topic Date Due   HIV Screening  Never done   Hepatitis C Screening  Never done   DTaP/Tdap/Td (2 - Tdap) 02/29/2016   Zoster Vaccines- Shingrix (1 of 2) Never done   Colonoscopy  05/05/2022   COVID-19 Vaccine (1 - 2024-25 season) Never done   INFLUENZA VACCINE  02/29/2024   HPV VACCINES  Aged Out   Meningococcal B Vaccine  Aged Out     ----------------------------------------------------------------------------------------------------------------------------------------------------------------------------------------------------------------- Physical Exam BP 110/72 (BP Location: Left Arm, Patient Position: Sitting, Cuff Size: Normal)   Pulse 87   Ht 5' 11 (1.803 m)   Wt 217 lb (98.4 kg)   SpO2 98%   BMI 30.27 kg/m   Physical Exam Constitutional:      Appearance: Normal appearance.   Cardiovascular:     Rate and Rhythm: Normal rate and regular rhythm.   Neurological:     General: No focal deficit present.     Mental Status: He is alert.   Psychiatric:        Mood and Affect: Mood normal.        Behavior: Behavior normal.     ------------------------------------------------------------------------------------------------------------------------------------------------------------------------------------------------------------------- Assessment and Plan  Dysuria UA normal.  Urine sent for culture including ureaplasma and gc/chlamydia.  Empiric coverage with doxycycline .     Meds ordered this encounter  Medications   doxycycline  (VIBRA -TABS) 100 MG tablet    Sig: Take 1 tablet (100 mg total) by mouth 2 (two) times daily.    Dispense:  14 tablet    Refill:  0    No follow-ups on file.

## 2024-01-19 LAB — URINE CULTURE

## 2024-01-21 LAB — CHLAMYDIA/GONOCOCCUS/TRICHOMONAS, NAA
Chlamydia by NAA: NEGATIVE
Gonococcus by NAA: NEGATIVE
Trich vag by NAA: NEGATIVE

## 2024-01-22 ENCOUNTER — Ambulatory Visit: Payer: Self-pay | Admitting: Family Medicine

## 2024-01-24 LAB — MYCOPLASMA / UREAPLASMA CULTURE
Mycoplasma hominis Culture: NEGATIVE
Ureaplasma urealyticum: POSITIVE — AB

## 2024-01-30 ENCOUNTER — Ambulatory Visit: Payer: Self-pay

## 2024-01-30 NOTE — Telephone Encounter (Signed)
 FYI Only or Action Required?: Action required by provider: clinical question for provider and update on patient condition.  Patient was last seen in primary care on 01/17/2024 by Alvia Bring, DO. Called Nurse Triage reporting persistent urinary sx. Symptoms began several days ago. Interventions attempted: Other: completed antibiotics. Symptoms are: gradually improving.  Triage Disposition: See HCP Within 4 Hours (Or PCP Triage)  Patient/caregiver understands and will follow disposition?: No, wishes to speak with PCP  Message from [facility] sent at 01/30/2024  2:26 PM EDT  Reason for Triage: recently diagnosed with an UTI, ( 3 wks ago) but still  having  Lower abdominal discomfort   Reason for Disposition  [1] MILD-MODERATE pain AND [2] constant AND [3] present > 2 hours  Answer Assessment - Initial Assessment Questions 1. LOCATION: Where does it hurt?      Right sided lower abdomen, seems that 'testicle pain changed, moved up to lower abdomen' pain in testicle now resolved 2. RADIATION: Does the pain shoot anywhere else? (e.g., chest, back)     Denies  3. ONSET: When did the pain begin? (Minutes, hours or days ago)      Discomforted started around 3 weeks ago in testicle, now in abdomen for few days 4. SUDDEN: Gradual or sudden onset?     gradual 5. PATTERN Does the pain come and go, or is it constant?    - If it comes and goes: How long does it last? Do you have pain now?     (Note: Comes and goes means the pain is intermittent. It goes away completely between bouts.)    - If constant: Is it getting better, staying the same, or getting worse?      (Note: Constant means the pain never goes away completely; most serious pain is constant and gets worse.)      constant 6. SEVERITY: How bad is the pain?  (e.g., Scale 1-10; mild, moderate, or severe)    - MILD (1-3): Doesn't interfere with normal activities, abdomen soft and not tender to touch.     - MODERATE (4-7):  Interferes with normal activities or awakens from sleep, abdomen tender to touch.     - SEVERE (8-10): Excruciating pain, doubled over, unable to do any normal activities.       4/10 pain  7. RECURRENT SYMPTOM: Have you ever had this type of stomach pain before? If Yes, ask: When was the last time? and What happened that time?      Ongoing for 3 weeks  8. CAUSE: What do you think is causing the stomach pain?     Unsure uti 9. RELIEVING/AGGRAVATING FACTORS: What makes it better or worse? (e.g., antacids, bending or twisting motion, bowel movement)     Walking  10. OTHER SYMPTOMS: Do you have any other symptoms? (e.g., back pain, diarrhea, fever, urination pain, vomiting)       Denies   Additional info: recently treated for uti, overall feels symptoms have improved with exception to abdominal pain. [place], requesting recommendation by pcp. Will proceed to urgent care if symptoms worsen or develops new symptoms.  Protocols used: Abdominal Pain - Male-A-AH

## 2024-02-04 NOTE — Telephone Encounter (Signed)
 I spoke with patient and he is going to the urgent care. He will call back tomorrow to schedule.

## 2024-02-05 NOTE — Telephone Encounter (Signed)
 Patient scheduled.

## 2024-02-20 ENCOUNTER — Telehealth: Payer: Self-pay

## 2024-02-20 NOTE — Telephone Encounter (Signed)
 Copied from CRM [id number]. Topic: General - Other >> Feb 19, 2024  2:03 PM Diannia H wrote: Reason for CRM: Patient is calling to see if he needs to have xrays done before his visit in 08/06 because where he is now [place] he [place] have to [place] out of pocket and if he can wait to get them the day of his appointment he would like to know if that's okay, callback number is [phone].

## 2024-02-21 NOTE — Telephone Encounter (Signed)
 Patient informed.

## 2024-03-05 ENCOUNTER — Ambulatory Visit: Admitting: Family Medicine

## 2024-03-05 ENCOUNTER — Ambulatory Visit (INDEPENDENT_AMBULATORY_CARE_PROVIDER_SITE_OTHER): Admitting: Sports Medicine

## 2024-03-05 VITALS — BP 123/76 | HR 75 | Ht 71.0 in | Wt 215.0 lb

## 2024-03-05 DIAGNOSIS — N451 Epididymitis: Secondary | ICD-10-CM | POA: Diagnosis not present

## 2024-03-05 NOTE — Progress Notes (Signed)
    Procedures performed today:    None.  Independent interpretation of notes and tests performed by another provider:   None.  Brief History, Exam, Impression, and Recommendations:    Epididymitis Testicular pain diagnosed epididymitis by my partner, he was given Keflex  and then doxycycline , STD testing negative. He reports complete symptom resolution, no masses, no pain, no dysuria, I advised him to let us  know if symptoms return. We gave him anticipatory guidance regarding testicular pain and what to do with signs of torsion. We discussed testicular self-exam. Return as needed.    ____________________________________________ Debby PARAS. Curtis, M.D., ABFM., CAQSM., AME. Primary Care and Sports Medicine [facility]  Adjunct Professor of [facility] [employer] [facility] Medicine  [employer]  Restaurant manager, [employer]

## 2024-03-05 NOTE — Assessment & Plan Note (Signed)
 Testicular pain diagnosed epididymitis by my partner, he was given Keflex  and then doxycycline , STD testing negative. He reports complete symptom resolution, no masses, no pain, no dysuria, I advised him to let us  know if symptoms return. We gave him anticipatory guidance regarding testicular pain and what to do with signs of torsion. We discussed testicular self-exam. Return as needed.

## 2024-04-01 ENCOUNTER — Encounter: Payer: Self-pay | Admitting: Sports Medicine

## 2024-07-22 ENCOUNTER — Ambulatory Visit: Payer: Self-pay

## 2024-07-22 NOTE — Telephone Encounter (Signed)
 FYI Only or Action Required?: Action required by provider: request for appointment.  Patient was last seen in primary care on 03/05/2024 by Curtis Debby PARAS, MD.  Called Nurse Triage reporting Rectal Pain.  Symptoms began several weeks ago.  Interventions attempted: OTC medications: Preperation H.  Symptoms are: unchanged.  Triage Disposition: See PCP When Office is Open (Within 3 Days)  Patient/caregiver understands and will follow disposition?:   Copied from CRM #[id number]. Topic: Clinical - Red Word Triage >> Jul 22, 2024  1:16 PM Victoria B wrote: [facility] that prompted transfer to Nurse Triage: patient states has itching in buttock area and stool doesn't connect, its like balls Reason for Disposition  Nursing judgment or information in reference  Answer Assessment - Initial Assessment Questions Wants to schedule for the beginning of January, out of town now. Told him to go to [facility] in [place]  if needed.  1. REASON FOR CALL: What is your main concern right now?     Itching on buttocks. States his stool is like balls, has had this before he did his colonoscopy and they removed polyps. Has had some change in frequency as well, 1-2 a day and not much, which is less. Using preperation H for 1 week with a little change. Told him he could use zinc oxide and see if that helps more. 2. ONSET: When did the itching start?     2 weeks ago 3. SEVERITY: How bad is the itching?     Is improving with preperation H 6. FEVER: Do you have a fever?     Denies 7. OTHER SYMPTOMS: Do you have any other new symptoms?     Denies  Protocols used: No Guideline Available-A-[facility]

## 2024-08-04 ENCOUNTER — Ambulatory Visit: Admitting: Family Medicine

## 2024-08-18 ENCOUNTER — Ambulatory Visit: Admitting: Family Medicine
